# Patient Record
Sex: Male | Born: 1990 | Race: White | Hispanic: No | Marital: Married | Smoking: Never smoker
Health system: Southern US, Community
[De-identification: ages and names within clinical notes are randomized; demographics above are authoritative.]

## PROBLEM LIST (undated history)

## (undated) DIAGNOSIS — T783XXA Angioneurotic edema, initial encounter: Secondary | ICD-10-CM

## (undated) DIAGNOSIS — T8859XA Other complications of anesthesia, initial encounter: Secondary | ICD-10-CM

## (undated) DIAGNOSIS — R519 Headache, unspecified: Secondary | ICD-10-CM

## (undated) DIAGNOSIS — R42 Dizziness and giddiness: Secondary | ICD-10-CM

## (undated) DIAGNOSIS — A770 Spotted fever due to Rickettsia rickettsii: Secondary | ICD-10-CM

## (undated) HISTORY — DX: Dizziness and giddiness: R42

## (undated) HISTORY — DX: Angioneurotic edema, initial encounter: T78.3XXA

## (undated) HISTORY — DX: Headache, unspecified: R51.9

## (undated) HISTORY — DX: Spotted fever due to Rickettsia rickettsii: A77.0

---

## 2019-09-27 HISTORY — PX: SHOULDER SURGERY: SHX246

## 2019-12-05 ENCOUNTER — Ambulatory Visit: Payer: Self-pay | Attending: Family

## 2019-12-05 DIAGNOSIS — Z23 Encounter for immunization: Secondary | ICD-10-CM

## 2019-12-05 NOTE — Progress Notes (Signed)
   Covid-19 Vaccination Clinic  Name:  Trevor White    MRN: [id number] DOB: 05/03/1991  12/05/2019  [name] was observed post Covid-19 immunization for 15 minutes without incident. He was provided with Vaccine Information Sheet and instruction to access the V-Safe system.   [name] was instructed to call 911 with any severe reactions post vaccine: Marland Kitchen Difficulty breathing  . Swelling of face and throat  . A fast heartbeat  . A bad rash all over body  . Dizziness and weakness   Immunizations Administered    Name Date Dose VIS Date Route   Moderna COVID-19 Vaccine 12/05/2019  3:07 PM 0.5 mL 08/27/2019 Intramuscular   Manufacturer: Moderna   Lot: [id number]   NDC: 56812-751-70

## 2020-01-07 ENCOUNTER — Ambulatory Visit: Payer: Self-pay | Attending: Family

## 2020-01-07 DIAGNOSIS — Z23 Encounter for immunization: Secondary | ICD-10-CM

## 2020-01-07 NOTE — Progress Notes (Signed)
   Covid-19 Vaccination Clinic  Name:  Trevor White    MRN: [id number] DOB: 02-16-1991  01/07/2020  Mr. Trevor White was observed post Covid-19 immunization for 15 minutes without incident. He was provided with Vaccine Information Sheet and instruction to access the V-Safe system.   Mr. Trevor White was instructed to call 911 with any severe reactions post vaccine: Marland Kitchen Difficulty breathing  . Swelling of face and throat  . A fast heartbeat  . A bad rash all over body  . Dizziness and weakness   Immunizations Administered    Name Date Dose VIS Date Route   Moderna COVID-19 Vaccine 01/07/2020 11:37 AM 0.5 mL 08/27/2019 Intramuscular   Manufacturer: Moderna   Lot: [id number]   NDC: 31121-624-46

## 2021-05-28 ENCOUNTER — Other Ambulatory Visit: Payer: Self-pay

## 2021-05-28 ENCOUNTER — Encounter (HOSPITAL_COMMUNITY): Payer: Self-pay

## 2021-05-28 ENCOUNTER — Emergency Department (HOSPITAL_COMMUNITY): Payer: 59

## 2021-05-28 ENCOUNTER — Emergency Department (HOSPITAL_COMMUNITY)
Admission: EM | Admit: 2021-05-28 | Discharge: 2021-05-28 | Disposition: A | Discharge status: Home / Self Care | Payer: 59 | Attending: Emergency Medicine | Admitting: Emergency Medicine

## 2021-05-28 DIAGNOSIS — R1031 Right lower quadrant pain: Secondary | ICD-10-CM | POA: Diagnosis present

## 2021-05-28 LAB — URINALYSIS, ROUTINE W REFLEX MICROSCOPIC
Bilirubin Urine: NEGATIVE
Glucose, UA: NEGATIVE mg/dL
Hgb urine dipstick: NEGATIVE
Ketones, ur: NEGATIVE mg/dL
Leukocytes,Ua: NEGATIVE
Nitrite: NEGATIVE
Protein, ur: NEGATIVE mg/dL
Specific Gravity, Urine: 1.02 (ref 1.005–1.030)
pH: 8 (ref 5.0–8.0)

## 2021-05-28 LAB — COMPREHENSIVE METABOLIC PANEL
ALT: 18 U/L (ref 0–44)
AST: 26 U/L (ref 15–41)
Albumin: 4.4 g/dL (ref 3.5–5.0)
Alkaline Phosphatase: 44 U/L (ref 38–126)
Anion gap: 9 (ref 5–15)
BUN: 11 mg/dL (ref 6–20)
CO2: 25 mmol/L (ref 22–32)
Calcium: 9.3 mg/dL (ref 8.9–10.3)
Chloride: 101 mmol/L (ref 98–111)
Creatinine, Ser: 1.03 mg/dL (ref 0.61–1.24)
GFR, Estimated: >60 mL/min (ref >60)
Glucose, Bld: 101 mg/dL — ABNORMAL HIGH (ref 70–99)
Potassium: 4 mmol/L (ref 3.5–5.1)
Sodium: 135 mmol/L (ref 135–145)
Total Bilirubin: 1 mg/dL (ref 0.3–1.2)
Total Protein: 7.2 g/dL (ref 6.5–8.1)

## 2021-05-28 LAB — CBC WITH DIFFERENTIAL/PLATELET
Abs Immature Granulocytes: 0.01 10*3/uL (ref 0.00–0.07)
Basophils Absolute: 0 10*3/uL (ref 0.0–0.1)
Basophils Relative: 1 %
Eosinophils Absolute: 0.1 10*3/uL (ref 0.0–0.5)
Eosinophils Relative: 1 %
HCT: 45.2 % (ref 39.0–52.0)
Hemoglobin: 14.8 g/dL (ref 13.0–17.0)
Immature Granulocytes: 0 %
Lymphocytes Relative: 32 %
Lymphs Abs: 1.8 10*3/uL (ref 0.7–4.0)
MCH: 28.1 pg (ref 26.0–34.0)
MCHC: 32.7 g/dL (ref 30.0–36.0)
MCV: 85.9 fL (ref 80.0–100.0)
Monocytes Absolute: 0.4 10*3/uL (ref 0.1–1.0)
Monocytes Relative: 7 %
Neutro Abs: 3.4 10*3/uL (ref 1.7–7.7)
Neutrophils Relative %: 59 %
Platelets: 234 10*3/uL (ref 150–400)
RBC: 5.26 MIL/uL (ref 4.22–5.81)
RDW: 12.3 % (ref 11.5–15.5)
WBC: 5.7 10*3/uL (ref 4.0–10.5)
nRBC: 0 % (ref 0.0–0.2)

## 2021-05-28 LAB — LIPASE, BLOOD: Lipase: 28 U/L (ref 11–51)

## 2021-05-28 MED ORDER — IOHEXOL 350 MG/ML SOLN
100.0000 mL | Freq: Once | INTRAVENOUS | Status: AC | PRN
  Administered 2021-05-28: 100 mL via INTRAVENOUS

## 2021-05-28 MED ORDER — SODIUM CHLORIDE 0.9 % IV BOLUS
1000.0000 mL | Freq: Once | INTRAVENOUS | Status: AC
  Administered 2021-05-28: 1000 mL via INTRAVENOUS

## 2021-05-28 NOTE — ED Provider Notes (Signed)
[facility] EMERGENCY DEPARTMENT Provider Note   CSN: [id number] Arrival date & time: 05/28/21  1050     History Chief Complaint  Patient presents with   Abdominal Pain    Harce Volden is a 30 y.o. male.  30 year old male presents with complaint of right lower quadrant abdominal pain, sent by PCP with concern for appendicitis.  Patient states that he has had pain off and on for the past week, located periumbilical radiating to suprapubic area, described as a sharp/occasional gas pain.  Patient states that he usually feels better after going for a run however ran on the treadmill this morning which made his right lower quadrant pain worse.  Went to his doctor who pressed on his abdomen and has had constant/progressively worsening right lower quadrant pain since.  States that a few days ago he had 1 episode where he had some red blood in his stools, none since.  Also states 3 days ago his urine was cloudy with an odor but this has not reoccurred.  He denies testicular pain or swelling.  Last ate at 6 AM today, handful of blueberries.  No prior abdominal surgeries, no history of asthma or respiratory complaints, no other medical history.  Patient is a non-smoker states about 2 weeks ago he did have more alcohol than usual to drink although does not typically drink heavily.      History reviewed. No pertinent past medical history.  There are no problems to display for this patient.   History reviewed. No pertinent surgical history.     No family history on file.     Home Medications Prior to Admission medications   Medication Sig Start Date End Date Taking? Authorizing Provider  Magnesium 200 MG TABS Take 200 mg by mouth daily.   Yes [provider]    Allergies    Diclofenac sodium  Review of Systems   Review of Systems  Constitutional:  Negative for chills, diaphoresis and fever.  Respiratory:  Negative for shortness of breath.   Cardiovascular:   Negative for chest pain.  Gastrointestinal:  Positive for abdominal pain and blood in stool. Negative for constipation, diarrhea, nausea and vomiting.  Genitourinary:  Negative for dysuria, frequency, penile discharge, penile pain, penile swelling, scrotal swelling and testicular pain.  Musculoskeletal:  Negative for arthralgias, back pain and myalgias.  Skin:  Negative for rash and wound.  Allergic/Immunologic: Negative for immunocompromised state.  Neurological:  Negative for weakness.  Hematological:  Negative for adenopathy.  All other systems reviewed and are negative.  Physical Exam Updated Vital Signs BP 138/74 (BP Location: Left Arm)   Pulse 77   Temp 98.8 F (37.1 C) (Oral)   Resp 18   Ht 5\' 6"  (1.676 m)   Wt 58.1 kg   SpO2 100%   BMI 20.66 kg/m   Physical Exam Vitals and nursing note reviewed.  Constitutional:      General: He is not in acute distress.    Appearance: He is well-developed. He is not diaphoretic.  HENT:     Head: Normocephalic and atraumatic.  Cardiovascular:     Rate and Rhythm: Normal rate and regular rhythm.     Heart sounds: Normal heart sounds.  Pulmonary:     Effort: Pulmonary effort is normal.  Abdominal:     General: Abdomen is flat.     Palpations: Abdomen is soft.     Tenderness: There is abdominal tenderness in the right lower quadrant. There is no right  CVA tenderness or left CVA tenderness.  Skin:    General: Skin is warm and dry.  Neurological:     Mental Status: He is alert and oriented to person, place, and time.  Psychiatric:        Behavior: Behavior normal.    ED Results / Procedures / Treatments   Labs (all labs ordered are listed, but only abnormal results are displayed) Labs Reviewed  COMPREHENSIVE METABOLIC PANEL - Abnormal; Notable for the following components:      Result Value   Glucose, Bld 101 (*)    All other components within normal limits  URINALYSIS, ROUTINE W REFLEX MICROSCOPIC - Abnormal; Notable for  the following components:   APPearance CLOUDY (*)    All other components within normal limits  CBC WITH DIFFERENTIAL/PLATELET  LIPASE, BLOOD    EKG None  Radiology CT Abdomen Pelvis W Contrast  Result Date: 05/28/2021 CLINICAL DATA:  Right lower quadrant abdominal pain. EXAM: CT ABDOMEN AND PELVIS WITH CONTRAST TECHNIQUE: Multidetector CT imaging of the abdomen and pelvis was performed using the standard protocol following bolus administration of intravenous contrast. CONTRAST:  OMNIPAQUE IOHEXOL 350 MG/ML SOLN COMPARISON:  None. FINDINGS: Lower chest: No acute abnormality. Hepatobiliary: No focal liver abnormality is seen. No gallstones, gallbladder Purtee thickening, or biliary dilatation. Pancreas: Unremarkable. No pancreatic ductal dilatation or surrounding inflammatory changes. Spleen: Normal in size without focal abnormality. Adrenals/Urinary Tract: Adrenal glands are unremarkable. Kidneys are normal, without renal calculi, solid enhancing lesion, or hydronephrosis. Bladder is unremarkable for degree of distension. Stomach/Bowel: Stomach is grossly unremarkable. No pathologic dilation of small large bowel. The appendix and terminal ileum are unremarkable. No evidence of acute bowel inflammation. Vascular/Lymphatic: No significant vascular findings are present. No enlarged abdominal or pelvic lymph nodes. Reproductive: Prostate is unremarkable. Other: No abdominopelvic ascites. No walled off fluid collections. No pneumoperitoneum. Musculoskeletal: No acute or significant osseous findings. IMPRESSION: No acute abdominopelvic findings. Normal appendix. Electronically Signed   By: Maudry Mayhew M.D.   On: 05/28/2021 16:25    Procedures Procedures   Medications Ordered in ED Medications  sodium chloride 0.9 % bolus 1,000 mL (0 mLs Intravenous Stopped 05/28/21 1328)  iohexol (OMNIPAQUE) 350 MG/ML injection 100 mL (100 mLs Intravenous Contrast Given 05/28/21 1604)    ED Course  I have  reviewed the triage vital signs and the nursing notes.  Pertinent labs & imaging results that were available during my care of the patient were reviewed by me and considered in my medical decision making (see chart for details).  Clinical Course as of 05/28/21 1645  Fri May 28, 2021  1454 This is a otherwise healthy 30 year old male presented to ED with concern for right lower quadrant abdominal pain and poor p.o. intake for about 1 week.  He was referred in for appendicitis evaluation.  On clinical exam the patient is well-appearing.  He is afebrile.  He does have some mild right lower quadrant abdominal tenderness.  No rigidity on abdominal exam.  White blood cell count is normal.  Labs are otherwise unremarkable.  He is pending a CT scan of the abdomen. [MT]  1454 Of note, while in the emergency department using the bathroom, the patient reported abrupt onset of blurred vision in both of his eyes.  He denies loss of vision or blackened vision or visual cuts. He denies headache.  He says his symptoms have gradually improved.  He reports feeling very anxious in the Ed and wonders if the lighting made him  feel blurred.  On my exam vision is excellent, 20/10 both eyes, no peripheral field deficits, PERRL, EOM.  He has no headache, but this may be a prodrome for a migraine.  He feels that his left eye is slightly blurrier than the right and squiggly.  Seems less likely related to an aneurysm or subarachnoid hemorrhage without active headache. We will continue to monitor him as he is improving [MT]  1645 CT scan shows normal appendix.  Labs reassuring including CBC, CMP, lipase.  Urinalysis is cloudy otherwise unremarkable.  Vitals stable with O2 sat 100% on room air.  Patient plans to follow-up with GI, referral given.  Inquires about medications to take for his constipation, discussed use of MiraLAX and Colace.  Given return to ER precautions. [LM]    Clinical Course User Index [LM] Jeannie Fend,  PA-C [MT] Renaye Rakers Kermit Balo, MD   MDM Rules/Calculators/A&P                           Final Clinical Impression(s) / ED Diagnoses Final diagnoses:  Right lower quadrant abdominal pain    Rx / DC Orders ED Discharge Orders     None        Jeannie Fend, PA-C 05/28/21 1646    Terald Sleeper, MD 05/28/21 [phone]

## 2021-05-28 NOTE — ED Notes (Addendum)
When pt returned from restroom. He stated he has blurred vision and pressure behind eyes all of the sudden. Notified EDP

## 2021-05-28 NOTE — ED Notes (Signed)
EDP at the bedside. Turned off lights to see if it made a difference to pt vision. EDP will return in five minutes

## 2021-05-28 NOTE — ED Notes (Signed)
Pt ambulated to restroom with steady gait.

## 2021-05-28 NOTE — Discharge Instructions (Addendum)
MiraLAX and Colace as needed.  Given referral for GI today, you may call tomorrow to schedule an appointment.  Follow-up with your primary care provider as needed.  Return to ER for fever, worsening pain, vomiting or other concerns.

## 2021-05-28 NOTE — ED Triage Notes (Signed)
Pt c.o RLQ pain for the past week along with bloody stools last week, none this week. Denies n/v/d.

## 2021-10-01 DIAGNOSIS — M7712 Lateral epicondylitis, left elbow: Secondary | ICD-10-CM | POA: Diagnosis not present

## 2021-10-19 DIAGNOSIS — M25522 Pain in left elbow: Secondary | ICD-10-CM | POA: Diagnosis not present

## 2021-11-10 DIAGNOSIS — J069 Acute upper respiratory infection, unspecified: Secondary | ICD-10-CM | POA: Diagnosis not present

## 2022-03-24 DIAGNOSIS — R52 Pain, unspecified: Secondary | ICD-10-CM | POA: Diagnosis not present

## 2022-03-24 DIAGNOSIS — W57XXXA Bitten or stung by nonvenomous insect and other nonvenomous arthropods, initial encounter: Secondary | ICD-10-CM | POA: Diagnosis not present

## 2022-03-24 DIAGNOSIS — S90561A Insect bite (nonvenomous), right ankle, initial encounter: Secondary | ICD-10-CM | POA: Diagnosis not present

## 2022-03-24 DIAGNOSIS — J029 Acute pharyngitis, unspecified: Secondary | ICD-10-CM | POA: Diagnosis not present

## 2022-03-24 DIAGNOSIS — R5383 Other fatigue: Secondary | ICD-10-CM | POA: Diagnosis not present

## 2022-03-24 DIAGNOSIS — Z03818 Encounter for observation for suspected exposure to other biological agents ruled out: Secondary | ICD-10-CM | POA: Diagnosis not present

## 2022-03-24 DIAGNOSIS — S30865A Insect bite (nonvenomous) of unspecified external genital organs, male, initial encounter: Secondary | ICD-10-CM | POA: Diagnosis not present

## 2022-03-24 DIAGNOSIS — S20362A Insect bite (nonvenomous) of left front wall of thorax, initial encounter: Secondary | ICD-10-CM | POA: Diagnosis not present

## 2022-03-31 DIAGNOSIS — A770 Spotted fever due to Rickettsia rickettsii: Secondary | ICD-10-CM

## 2022-03-31 HISTORY — DX: Spotted fever due to Rickettsia rickettsii: A77.0

## 2022-04-01 DIAGNOSIS — R002 Palpitations: Secondary | ICD-10-CM | POA: Diagnosis not present

## 2022-04-01 DIAGNOSIS — W57XXXD Bitten or stung by nonvenomous insect and other nonvenomous arthropods, subsequent encounter: Secondary | ICD-10-CM | POA: Diagnosis not present

## 2022-04-01 DIAGNOSIS — S30865A Insect bite (nonvenomous) of unspecified external genital organs, male, initial encounter: Secondary | ICD-10-CM | POA: Diagnosis not present

## 2022-04-07 NOTE — Progress Notes (Signed)
Cardiology Office Note:    Date:  04/08/2022   ID:  August Saucer, DOB 1991/06/23, MRN [id number]  PCP:  [facility] Providers Cardiologist:  Alverda Skeans, MD Referring MD: Mitzi Hansen, NP   Chief Complaint/Reason for Referral: Palpitations  ASSESSMENT:    1. Palpitations   2. Precordial pain     PLAN:    In order of problems listed above: 1.  Palpitations: We will check CBC, reflex TSH, echocardiogram, and monitor.  We will keep follow-up open-ended depending on these results. 2.  Chest pain: Patient has symptoms of chest pain that seem atypical in nature.  We will refer for an exercise treadmill stress test.         Shared Decision Making/Informed Consent The risks [chest pain, shortness of breath, cardiac arrhythmias, dizziness, blood pressure fluctuations, myocardial infarction, stroke/transient ischemic attack, and life-threatening complications (estimated to be 1 in 10,000)], benefits (risk stratification, diagnosing coronary artery disease, treatment guidance) and alternatives of an exercise tolerance test were discussed in detail with Mr. Dishman and he agrees to proceed.   Dispo:  Return if symptoms worsen or fail to improve.      Medication Adjustments/Labs and Tests Ordered: Current medicines are reviewed at length with the patient today.  Concerns regarding medicines are outlined above.  The following changes have been made:  no change   Labs/tests ordered: Orders Placed This Encounter  Procedures   CBC with Differential/Platelet   TSH Rfx on Abnormal to Free T4   EXERCISE TOLERANCE TEST (ETT)   LONG TERM MONITOR (3-14 DAYS)   ECHOCARDIOGRAM COMPLETE    Medication Changes: No orders of the defined types were placed in this encounter.    Current medicines are reviewed at length with the patient today.  The patient does not have concerns regarding medicines.   History of Present Illness:    FOCUSED PROBLEM  LIST:   1.  [place] Spotted Fever infection 6/23  The patient is a 31 y.o. male with the indicated medical history here for palpitations.  The patient was seen by his primary care provider recently with complaints of palpitations.  An EKG was performed which was reassuring with normal sinus rhythm.  The patient was recently diagnosed with [facility] spotted fever and was started doxycycline earlier this week.  He contracted this back in June.  Since that time he has noticed increasing palpitations on a daily basis.  He will have a salvo of palpitations then they will remit.  There sometimes associated with chest pain and shortness of breath.  He denies any presyncope or syncope.  He denies any paroxysmal nocturnal dyspnea.  When he lays in bed on his right side he will feel palpitations.  Fortunately has not required any emergency room visits or hospitalizations.  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       Previous Medical History: None   Current Medications: Current Meds  Medication Sig   Bacillus Coagulans-Inulin (ALIGN PREBIOTIC-PROBIOTIC) 5-1.25 MG-GM CHEW 3 (three) times a week.   DOXYCYCLINE HYCLATE PO Take 100 mg by mouth 2 (two) times daily. Pt is instructed to take this medication BID for 14 days.     Allergies:    Diclofenac sodium   Social History:   Social History   Tobacco Use   Smoking status: Never     Family Hx: History reviewed. No pertinent family history.   Review of Systems:   Please  see the history of present illness.    All other systems reviewed and are negative.     EKGs/Labs/Other Test Reviewed:    EKG:  EKG performed July 2023 that I personally reviewed demonstrates sinus rhythm  Prior CV studies: None available    Other studies Reviewed: Review of the additional studies/records demonstrates: CT abdomen pelvis 2022 without aortic atherosclerosis  Recent Labs: 05/28/2021: ALT 18; BUN 11; Creatinine, Ser 1.03;  Hemoglobin 14.8; Platelets 234; Potassium 4.0; Sodium 135   Recent Lipid Panel No results found for: "CHOL", "TRIG", "HDL", "LDLCALC", "LDLDIRECT"  Risk Assessment/Calculations:           Physical Exam:    VS:  BP 110/62   Pulse 69   Ht 5\' 6"  (1.676 m)   Wt 126 lb 9.6 oz (57.4 kg)   SpO2 99%   BMI 20.43 kg/m    Wt Readings from Last 3 Encounters:  04/08/22 126 lb 9.6 oz (57.4 kg)  05/28/21 128 lb (58.1 kg)    GENERAL:  No apparent distress, AOx3 HEENT:  No carotid bruits, +2 carotid impulses, no scleral icterus CAR: RRR no murmurs, gallops, rubs, or thrills RES:  Clear to auscultation bilaterally ABD:  Soft, nontender, nondistended, positive bowel sounds x 4 VASC:  +2 radial pulses, +2 carotid pulses, palpable pedal pulses NEURO:  CN 2-12 grossly intact; motor and sensory grossly intact PSYCH:  No active depression or anxiety EXT:  No edema, ecchymosis, or cyanosis  Signed, 07/28/21, MD  04/08/2022 9:26 AM    [facility] [address] [place], [place], [place]  [place] Phone: [phone]; Fax: [phone]   Note:  This document was prepared using Dragon voice recognition software and may include unintentional dictation errors.

## 2022-04-08 ENCOUNTER — Ambulatory Visit (INDEPENDENT_AMBULATORY_CARE_PROVIDER_SITE_OTHER): Payer: 59

## 2022-04-08 ENCOUNTER — Encounter: Payer: Self-pay | Admitting: Internal Medicine

## 2022-04-08 ENCOUNTER — Ambulatory Visit: Payer: 59 | Admitting: Internal Medicine

## 2022-04-08 VITALS — BP 110/62 | HR 69 | Ht 66.0 in | Wt 126.6 lb

## 2022-04-08 DIAGNOSIS — R002 Palpitations: Secondary | ICD-10-CM

## 2022-04-08 DIAGNOSIS — R072 Precordial pain: Secondary | ICD-10-CM | POA: Diagnosis not present

## 2022-04-08 LAB — CBC WITH DIFFERENTIAL/PLATELET
Basophils Absolute: 0 10*3/uL (ref 0.0–0.2)
Basos: 1 %
EOS (ABSOLUTE): 0.1 10*3/uL (ref 0.0–0.4)
Eos: 1 %
Hematocrit: 43.5 % (ref 37.5–51.0)
Hemoglobin: 14.5 g/dL (ref 13.0–17.7)
Lymphocytes Absolute: 2.6 10*3/uL (ref 0.7–3.1)
Lymphs: 44 %
MCH: 27.7 pg (ref 26.6–33.0)
MCHC: 33.3 g/dL (ref 31.5–35.7)
MCV: 83 fL (ref 79–97)
Monocytes Absolute: 0.4 10*3/uL (ref 0.1–0.9)
Monocytes: 7 %
Neutrophils Absolute: 2.8 10*3/uL (ref 1.4–7.0)
Neutrophils: 47 %
Platelets: 266 10*3/uL (ref 150–450)
RBC: 5.23 x10E6/uL (ref 4.14–5.80)
RDW: 13.6 % (ref 11.6–15.4)
WBC: 6 10*3/uL (ref 3.4–10.8)

## 2022-04-08 LAB — TSH RFX ON ABNORMAL TO FREE T4: TSH: 3.14 u[IU]/mL (ref 0.450–4.500)

## 2022-04-08 NOTE — Progress Notes (Unsigned)
Enrolled patient for a 3 day Zio XT monitor to be mailed to patients home  

## 2022-04-08 NOTE — Patient Instructions (Addendum)
Medication Instructions:  Your physician recommends that you continue on your current medications as directed. Please refer to the Current Medication list given to you today.  *If you need a refill on your cardiac medications before your next appointment, please call your pharmacy*  Lab Work: Your physician recommends that you have lab work today- Reflex TSH and CBC   If you have labs (blood work) drawn today and your tests are completely normal, you will receive your results only by: MyChart Message (if you have MyChart) OR A paper copy in the mail If you have any lab test that is abnormal or we need to change your treatment, we will call you to review the results.  Testing/Procedures: Your physician has requested that you have an exercise tolerance test. For further information please visit [email or web address] Please also follow instruction sheet, as given.  Your physician has requested that you have an echocardiogram. Echocardiography is a painless test that uses sound waves to create images of your heart. It provides your doctor with information about the size and shape of your heart and how well your heart's chambers and valves are working. This procedure takes approximately one hour. There are no restrictions for this procedure.  Your physician has recommended that you wear a zio patch monitor for 3 days.  Event monitors are medical devices that record the heart's electrical activity. Doctors most often [place] these monitors to diagnose arrhythmias. Arrhythmias are problems with the speed or rhythm of the heartbeat. The monitor is a small, portable device. You can wear one while you do your normal daily activities. This is usually used to diagnose what is causing palpitations/syncope (passing out).  Follow-Up: At [facility], you and your health needs are our priority.  As part of our continuing mission to provide you with exceptional heart care, we have created designated Provider Care  Teams.  These Care Teams include your primary Cardiologist (physician) and Advanced Practice Providers (APPs -  Physician Assistants and Nurse Practitioners) who all work together to provide you with the care you need, when you need it.  We recommend signing up for the patient portal called "MyChart".  Sign up information is provided on this After Visit Summary.  MyChart is used to connect with patients for Virtual Visits (Telemedicine).  Patients are able to view lab/test results, encounter notes, upcoming appointments, etc.  Non-urgent messages can be sent to your provider as well.   To learn more about what you can do with MyChart, go to [email or web address].    Your next appointment:   As needed  The format for your next appointment:   In Person  Provider:   Orbie Pyo, MD {  Other Instructions Christena Deem- Long Term Monitor Instructions  Your physician has requested you wear a ZIO patch monitor for 14 days.  This is a single patch monitor. [employer] supplies one patch monitor per enrollment. Additional stickers are not available. Please do not apply patch if you will be having a Nuclear Stress Test,  Echocardiogram, Cardiac CT, MRI, or Chest Xray during the period you would be wearing the  monitor. The patch cannot be worn during these tests. You cannot remove and re-apply the  ZIO XT patch monitor.  Your ZIO patch monitor will be mailed 3 day USPS to your address on file. It may take 3-5 days  to receive your monitor after you have been enrolled.  Once you have received your monitor, please review the enclosed instructions. Your monitor  has  already been registered assigning a specific monitor serial # to you.  Billing and Patient Assistance Program Information  We have supplied [employer] with any of your insurance information on file for billing purposes. [employer] offers a sliding scale Patient Assistance Program for patients that do not have  insurance, or whose insurance does  not completely cover the cost of the ZIO monitor.  You must apply for the Patient Assistance Program to qualify for this discounted rate.  To apply, please call [employer] at [phone], select option 4, select option 2, ask to apply for  Patient Assistance Program. Meredeth Ide will ask your household income, and how many people  are in your household. They will quote your out-of-pocket cost based on that information.  [employer] will also be able to set up a 27-month, interest-free payment plan if needed.  Applying the monitor   Shave hair from upper left chest.  Hold abrader disc by orange tab. Rub abrader in 40 strokes over the upper left chest as  indicated in your monitor instructions.  Clean area with 4 enclosed alcohol pads. Let dry.  Apply patch as indicated in monitor instructions. Patch will be placed under collarbone on left  side of chest with arrow pointing upward.  Rub patch adhesive wings for 2 minutes. Remove white label marked "1". Remove the white  label marked "2". Rub patch adhesive wings for 2 additional minutes.  While looking in a mirror, press and release button in center of patch. A small green light will  flash 3-4 times. This will be your only indicator that the monitor has been turned on.  Do not shower for the first 24 hours. You may shower after the first 24 hours.  Press the button if you feel a symptom. You will hear a small click. Record Date, Time and  Symptom in the Patient Logbook.  When you are ready to remove the patch, follow instructions on the last 2 pages of Patient  Logbook. Stick patch monitor onto the last page of Patient Logbook.  Place Patient Logbook in the blue and white box. Use locking tab on box and tape box closed  securely. The blue and white box has prepaid postage on it. Please place it in the mailbox as  soon as possible. Your physician should have your test results approximately 7 days after the  monitor has been mailed back to [facility].   Call [facility] Customer Care at [phone] if you have questions regarding  your ZIO XT patch monitor. Call them immediately if you see an orange light blinking on your  monitor.  If your monitor falls off in less than 4 days, contact our Monitor department at [phone].  If your monitor becomes loose or falls off after 4 days call [employer] at [phone] for  suggestions on securing your monitor   Important Information About Sugar

## 2022-04-10 IMAGING — CT CT ABD-PELV W/ CM
1 series · 1 of 1 positions shown · IV contrast (omnipaque)
Comparison: None.

CLINICAL DATA: Right lower quadrant abdominal pain.

EXAM:
CT ABDOMEN AND PELVIS WITH CONTRAST
TECHNIQUE: Multidetector CT imaging of the abdomen and pelvis was performed
using the standard protocol following bolus administration of
intravenous contrast.
CONTRAST:  100mL OMNIPAQUE IOHEXOL 350 MG/ML SOLN

[Series 2: topogram 0.6 t20f · sagittal · 1.00mm/px · 1 of 1 slices shown]
[im 1/1]
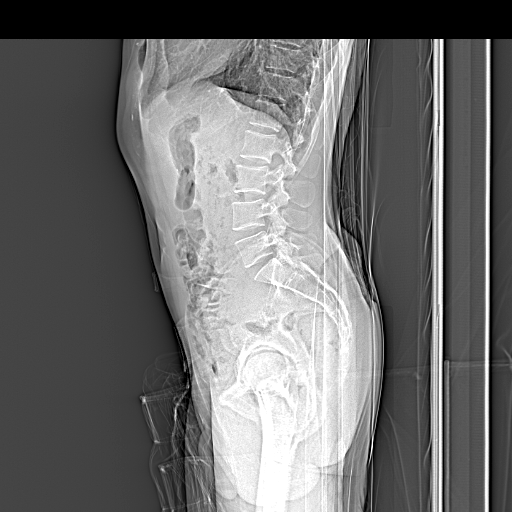

[1 of 1 positions shown; findings below may reference images not displayed]

FINDINGS: Lower chest: No acute abnormality.

Hepatobiliary: No focal liver abnormality is seen. No gallstones,
gallbladder wall thickening, or biliary dilatation.

Pancreas: Unremarkable. No pancreatic ductal dilatation or
surrounding inflammatory changes.

Spleen: Normal in size without focal abnormality.

Adrenals/Urinary Tract: Adrenal glands are unremarkable. Kidneys are
normal, without renal calculi, solid enhancing lesion, or
hydronephrosis. Bladder is unremarkable for degree of distension.

Stomach/Bowel: Stomach is grossly unremarkable. No pathologic
dilation of small large bowel. The appendix and terminal ileum are
unremarkable. No evidence of acute bowel inflammation.

Vascular/Lymphatic: No significant vascular findings are present. No
enlarged abdominal or pelvic lymph nodes.

Reproductive: Prostate is unremarkable.

Other: No abdominopelvic ascites. No walled off fluid collections.
No pneumoperitoneum.

Musculoskeletal: No acute or significant osseous findings.
IMPRESSION: No acute abdominopelvic findings. Normal appendix.

## 2022-04-12 DIAGNOSIS — R002 Palpitations: Secondary | ICD-10-CM

## 2022-04-12 DIAGNOSIS — R072 Precordial pain: Secondary | ICD-10-CM | POA: Diagnosis not present

## 2022-04-15 ENCOUNTER — Telehealth: Payer: Self-pay | Admitting: Internal Medicine

## 2022-04-15 NOTE — Telephone Encounter (Signed)
Returned call to patient and answered all his questions. He is able to wear the montior up to 14 days.

## 2022-04-15 NOTE — Telephone Encounter (Signed)
Patient calling in to see if he can keep the monitor on for a little longer. Please advise

## 2022-04-18 DIAGNOSIS — A770 Spotted fever due to Rickettsia rickettsii: Secondary | ICD-10-CM | POA: Diagnosis not present

## 2022-04-18 DIAGNOSIS — R21 Rash and other nonspecific skin eruption: Secondary | ICD-10-CM | POA: Diagnosis not present

## 2022-04-18 DIAGNOSIS — R519 Headache, unspecified: Secondary | ICD-10-CM | POA: Diagnosis not present

## 2022-04-21 ENCOUNTER — Ambulatory Visit (INDEPENDENT_AMBULATORY_CARE_PROVIDER_SITE_OTHER): Payer: 59

## 2022-04-21 ENCOUNTER — Ambulatory Visit (HOSPITAL_COMMUNITY): Payer: 59 | Attending: Cardiology

## 2022-04-21 DIAGNOSIS — R002 Palpitations: Secondary | ICD-10-CM | POA: Insufficient documentation

## 2022-04-21 DIAGNOSIS — R072 Precordial pain: Secondary | ICD-10-CM

## 2022-04-21 LAB — EXERCISE TOLERANCE TEST
Angina Index: 0
Duke Treadmill Score: 18
Estimated workload: 20.7
Exercise duration (min): 18 min
Exercise duration (sec): 0 s
MPHR: 189 {beats}/min
Peak HR: 184 {beats}/min
Percent HR: 97 %
RPE: 16
Rest HR: 71 {beats}/min
ST Depression (mm): 0 mm

## 2022-04-21 LAB — ECHOCARDIOGRAM COMPLETE: Area-P 1/2: 3.1 cm2

## 2022-04-22 DIAGNOSIS — R002 Palpitations: Secondary | ICD-10-CM | POA: Diagnosis not present

## 2022-04-22 DIAGNOSIS — R072 Precordial pain: Secondary | ICD-10-CM | POA: Diagnosis not present

## 2022-05-03 DIAGNOSIS — A770 Spotted fever due to Rickettsia rickettsii: Secondary | ICD-10-CM | POA: Diagnosis not present

## 2022-05-03 DIAGNOSIS — R519 Headache, unspecified: Secondary | ICD-10-CM | POA: Diagnosis not present

## 2022-05-04 ENCOUNTER — Encounter (HOSPITAL_COMMUNITY): Payer: Self-pay | Admitting: Emergency Medicine

## 2022-05-04 ENCOUNTER — Observation Stay (HOSPITAL_COMMUNITY)
Admission: EM | Admit: 2022-05-04 | Discharge: 2022-05-06 | Disposition: A | Discharge status: Home / Self Care | Payer: 59 | Attending: Internal Medicine | Admitting: Internal Medicine

## 2022-05-04 ENCOUNTER — Other Ambulatory Visit: Payer: Self-pay

## 2022-05-04 ENCOUNTER — Emergency Department (HOSPITAL_COMMUNITY): Payer: 59

## 2022-05-04 DIAGNOSIS — R509 Fever, unspecified: Secondary | ICD-10-CM | POA: Insufficient documentation

## 2022-05-04 DIAGNOSIS — R42 Dizziness and giddiness: Secondary | ICD-10-CM | POA: Diagnosis not present

## 2022-05-04 DIAGNOSIS — Z79899 Other long term (current) drug therapy: Secondary | ICD-10-CM | POA: Diagnosis not present

## 2022-05-04 DIAGNOSIS — Z20822 Contact with and (suspected) exposure to covid-19: Secondary | ICD-10-CM | POA: Diagnosis not present

## 2022-05-04 DIAGNOSIS — R519 Headache, unspecified: Principal | ICD-10-CM | POA: Insufficient documentation

## 2022-05-04 DIAGNOSIS — R9431 Abnormal electrocardiogram [ECG] [EKG]: Secondary | ICD-10-CM | POA: Diagnosis not present

## 2022-05-04 LAB — COMPREHENSIVE METABOLIC PANEL
ALT: 19 U/L (ref 0–44)
AST: 21 U/L (ref 15–41)
Albumin: 4.3 g/dL (ref 3.5–5.0)
Alkaline Phosphatase: 56 U/L (ref 38–126)
Anion gap: 9 (ref 5–15)
BUN: 10 mg/dL (ref 6–20)
CO2: 25 mmol/L (ref 22–32)
Calcium: 9.5 mg/dL (ref 8.9–10.3)
Chloride: 103 mmol/L (ref 98–111)
Creatinine, Ser: 1 mg/dL (ref 0.61–1.24)
GFR, Estimated: >60 mL/min (ref >60)
Glucose, Bld: 112 mg/dL — ABNORMAL HIGH (ref 70–99)
Potassium: 3.8 mmol/L (ref 3.5–5.1)
Sodium: 137 mmol/L (ref 135–145)
Total Bilirubin: 0.9 mg/dL (ref 0.3–1.2)
Total Protein: 7.4 g/dL (ref 6.5–8.1)

## 2022-05-04 LAB — CBC WITH DIFFERENTIAL/PLATELET
Abs Immature Granulocytes: 0.04 10*3/uL (ref 0.00–0.07)
Basophils Absolute: 0 10*3/uL (ref 0.0–0.1)
Basophils Relative: 0 %
Eosinophils Absolute: 0 10*3/uL (ref 0.0–0.5)
Eosinophils Relative: 0 %
HCT: 41.9 % (ref 39.0–52.0)
Hemoglobin: 14.1 g/dL (ref 13.0–17.0)
Immature Granulocytes: 1 %
Lymphocytes Relative: 6 %
Lymphs Abs: 0.5 10*3/uL — ABNORMAL LOW (ref 0.7–4.0)
MCH: 27.9 pg (ref 26.0–34.0)
MCHC: 33.7 g/dL (ref 30.0–36.0)
MCV: 83 fL (ref 80.0–100.0)
Monocytes Absolute: 0.6 10*3/uL (ref 0.1–1.0)
Monocytes Relative: 6 %
Neutro Abs: 7.7 10*3/uL (ref 1.7–7.7)
Neutrophils Relative %: 87 %
Platelets: 207 10*3/uL (ref 150–400)
RBC: 5.05 MIL/uL (ref 4.22–5.81)
RDW: 12.4 % (ref 11.5–15.5)
WBC: 8.9 10*3/uL (ref 4.0–10.5)
nRBC: 0 % (ref 0.0–0.2)

## 2022-05-04 NOTE — ED Provider Triage Note (Signed)
Emergency Medicine Provider Triage Evaluation Note  Trevor White , a 31 y.o. male  was evaluated in triage.  Pt complains of headache, neck stiffness has been ongoing for the last 9 days.  Diagnosed with [facility] spotted fever 4 weeks ago, was given 1 week of doxycycline, reports taking this medication after finishing this developed a headache which describes a throbbing sensation to the back of his head.  Exacerbated with any movement and rotation of his neck.  Also developed a rash to bilateral upper hands.. Fever up to 104 at home.  Review of Systems  Positive: Rash, headache, fever Negative: Vision loss, nausea, vomiting, abdominal pain  Physical Exam  BP 124/66 (BP Location: Left Arm)   Pulse (!) 111   Temp 98.7 F (37.1 C) (Oral)   Resp 20   SpO2 100%  Gen:   Awake, no distress   Resp:  Normal effort  MSK:   Moves extremities without difficulty  Other:  Full ROM of his neck, no meningeal signs.  Upper and lower extremities with good strength.  Medical Decision Making  Medically screening exam initiated at 5:09 PM.  Appropriate orders placed.  Flay Ghosh was informed that the remainder of the evaluation will be completed by another provider, this initial triage assessment does not replace that evaluation, and the importance of remaining in the ED until their evaluation is complete.     Claude Manges, PA-C 05/04/22 1714

## 2022-05-04 NOTE — ED Triage Notes (Signed)
Pt has diagnosis of [facility] spotted fever.  Has taken 3 weeks of doxycycline.  Today developed temp of 104, some confusion, (forgetting what he was doing), and headache  Sent here by Urgent care.  They did medicate his fever however and pt states he does feel much better since then.

## 2022-05-04 NOTE — ED Provider Notes (Incomplete)
[facility] EMERGENCY DEPARTMENT Provider Note  CSN: [id number] Arrival date & time: 05/04/22 1551  Chief Complaint(s) Headache  HPI Trevor White is a 31 y.o. male {Add pertinent medical, surgical, social history, OB history to HPI:1}    Headache   Past Medical History History reviewed. No pertinent past medical history. There are no problems to display for this patient.  Home Medication(s) Prior to Admission medications   Medication Sig Start Date End Date Taking? Authorizing Provider  Bacillus Coagulans-Inulin (ALIGN PREBIOTIC-PROBIOTIC) 5-1.25 MG-GM CHEW 3 (three) times a week.    [provider]  DOXYCYCLINE HYCLATE PO Take 100 mg by mouth 2 (two) times daily. Pt is instructed to take this medication BID for 14 days.    [provider]                                                                                                                                    Allergies Diclofenac sodium  Review of Systems Review of Systems  Neurological:  Positive for headaches.   As noted in HPI  Physical Exam Vital Signs  I have reviewed the triage vital signs BP 121/60   Pulse (!) 103   Temp 98.6 F (37 C)   Resp 16   SpO2 100%  *** Physical Exam  ED Results and Treatments Labs (all labs ordered are listed, but only abnormal results are displayed) Labs Reviewed  CBC WITH DIFFERENTIAL/PLATELET - Abnormal; Notable for the following components:      Result Value   Lymphs Abs 0.5 (*)    All other components within normal limits  COMPREHENSIVE METABOLIC PANEL - Abnormal; Notable for the following components:   Glucose, Bld 112 (*)    All other components within normal limits                                                                                                                         EKG  EKG Interpretation  Date/Time:    Ventricular Rate:    PR Interval:    QRS Duration:   QT Interval:    QTC Calculation:   R  Axis:     Text Interpretation:         Radiology No results found.  Medications Ordered in ED Medications - No data to display  Procedures Procedures  (including critical care time)  Medical Decision Making / ED Course   Medical Decision Making         Final Clinical Impression(s) / ED Diagnoses Final diagnoses:  None    {Document critical care time when appropriate:1}  {Document review of labs and clinical decision tools ie heart score, Chads2Vasc2 etc:1}  {Document your independent review of radiology images, and any outside records:1} {Document your discussion with family members, caretakers, and with consultants:1} {Document social determinants of health affecting pt's care:1} {Document your decision making why or why not admission, treatments were needed:1} This chart was dictated using voice recognition software.  Despite best efforts to proofread,  errors can occur which can change the documentation meaning.

## 2022-05-04 NOTE — ED Notes (Signed)
PA at bedside.

## 2022-05-04 NOTE — ED Notes (Signed)
Pt ambulatory to restroom and back.

## 2022-05-05 ENCOUNTER — Emergency Department (HOSPITAL_COMMUNITY): Payer: 59

## 2022-05-05 ENCOUNTER — Encounter (HOSPITAL_COMMUNITY): Payer: Self-pay | Admitting: Internal Medicine

## 2022-05-05 DIAGNOSIS — R519 Headache, unspecified: Secondary | ICD-10-CM | POA: Diagnosis present

## 2022-05-05 DIAGNOSIS — A770 Spotted fever due to Rickettsia rickettsii: Secondary | ICD-10-CM

## 2022-05-05 DIAGNOSIS — R509 Fever, unspecified: Secondary | ICD-10-CM | POA: Diagnosis not present

## 2022-05-05 DIAGNOSIS — G4459 Other complicated headache syndrome: Secondary | ICD-10-CM

## 2022-05-05 LAB — CBC WITH DIFFERENTIAL/PLATELET
Abs Immature Granulocytes: 0.03 10*3/uL (ref 0.00–0.07)
Basophils Absolute: 0 10*3/uL (ref 0.0–0.1)
Basophils Relative: 0 %
Eosinophils Absolute: 0 10*3/uL (ref 0.0–0.5)
Eosinophils Relative: 0 %
HCT: 42.9 % (ref 39.0–52.0)
Hemoglobin: 14.3 g/dL (ref 13.0–17.0)
Immature Granulocytes: 0 %
Lymphocytes Relative: 10 %
Lymphs Abs: 0.8 10*3/uL (ref 0.7–4.0)
MCH: 27.7 pg (ref 26.0–34.0)
MCHC: 33.3 g/dL (ref 30.0–36.0)
MCV: 83.1 fL (ref 80.0–100.0)
Monocytes Absolute: 0.6 10*3/uL (ref 0.1–1.0)
Monocytes Relative: 8 %
Neutro Abs: 6.1 10*3/uL (ref 1.7–7.7)
Neutrophils Relative %: 82 %
Platelets: 219 10*3/uL (ref 150–400)
RBC: 5.16 MIL/uL (ref 4.22–5.81)
RDW: 12.7 % (ref 11.5–15.5)
WBC: 7.5 10*3/uL (ref 4.0–10.5)
nRBC: 0 % (ref 0.0–0.2)

## 2022-05-05 LAB — CSF CELL COUNT WITH DIFFERENTIAL
RBC Count, CSF: 2 /mm3 — ABNORMAL HIGH
Tube #: 1

## 2022-05-05 LAB — RESP PANEL BY RT-PCR (FLU A&B, COVID) ARPGX2
Influenza A by PCR: NEGATIVE
Influenza B by PCR: NEGATIVE
SARS Coronavirus 2 by RT PCR: NEGATIVE

## 2022-05-05 LAB — HIV ANTIBODY (ROUTINE TESTING W REFLEX): HIV Screen 4th Generation wRfx: NONREACTIVE

## 2022-05-05 LAB — BASIC METABOLIC PANEL
Anion gap: 9 (ref 5–15)
BUN: 10 mg/dL (ref 6–20)
CO2: 24 mmol/L (ref 22–32)
Calcium: 9.4 mg/dL (ref 8.9–10.3)
Chloride: 102 mmol/L (ref 98–111)
Creatinine, Ser: 1.06 mg/dL (ref 0.61–1.24)
GFR, Estimated: >60 mL/min (ref >60)
Glucose, Bld: 119 mg/dL — ABNORMAL HIGH (ref 70–99)
Potassium: 3.3 mmol/L — ABNORMAL LOW (ref 3.5–5.1)
Sodium: 135 mmol/L (ref 135–145)

## 2022-05-05 LAB — HEPATIC FUNCTION PANEL
ALT: 18 U/L (ref 0–44)
AST: 22 U/L (ref 15–41)
Albumin: 4.3 g/dL (ref 3.5–5.0)
Alkaline Phosphatase: 59 U/L (ref 38–126)
Bilirubin, Direct: 0.1 mg/dL (ref 0.0–0.2)
Indirect Bilirubin: 0.5 mg/dL (ref 0.3–0.9)
Total Bilirubin: 0.6 mg/dL (ref 0.3–1.2)
Total Protein: 7.6 g/dL (ref 6.5–8.1)

## 2022-05-05 LAB — CRYPTOCOCCAL ANTIGEN, CSF: Crypto Ag: NEGATIVE

## 2022-05-05 LAB — PROTEIN AND GLUCOSE, CSF
Glucose, CSF: 74 mg/dL — ABNORMAL HIGH (ref 40–70)
Total  Protein, CSF: 32 mg/dL (ref 15–45)

## 2022-05-05 LAB — LIPASE, BLOOD: Lipase: 27 U/L (ref 11–51)

## 2022-05-05 MED ORDER — ACETAMINOPHEN 325 MG PO TABS: 650.0000 mg | ORAL_TABLET | Freq: Four times a day (QID) | ORAL | Status: DC | PRN

## 2022-05-05 MED ORDER — SODIUM CHLORIDE 0.9 % IV SOLN
1.0000 g | Freq: Once | INTRAVENOUS | Status: AC
  Administered 2022-05-05: 1 g via INTRAVENOUS
  Filled 2022-05-05: qty 10

## 2022-05-05 MED ORDER — SODIUM CHLORIDE 0.9 % IV BOLUS
1000.0000 mL | Freq: Once | INTRAVENOUS | Status: AC
  Administered 2022-05-05: 1000 mL via INTRAVENOUS

## 2022-05-05 MED ORDER — ACETAMINOPHEN 325 MG PO TABS
325.0000 mg | ORAL_TABLET | Freq: Once | ORAL | Status: AC
  Administered 2022-05-05: 325 mg via ORAL

## 2022-05-05 MED ORDER — LACTATED RINGERS IV SOLN: INTRAVENOUS | Status: AC

## 2022-05-05 MED ORDER — SODIUM CHLORIDE 0.9 % IV SOLN
100.0000 mg | Freq: Two times a day (BID) | INTRAVENOUS | Status: DC
  Administered 2022-05-05 – 2022-05-06 (×2): 100 mg via INTRAVENOUS
  Filled 2022-05-05 (×3): qty 100

## 2022-05-05 MED ORDER — DEXAMETHASONE SODIUM PHOSPHATE 10 MG/ML IJ SOLN
10.0000 mg | Freq: Once | INTRAMUSCULAR | Status: AC
  Administered 2022-05-05: 10 mg via INTRAVENOUS
  Filled 2022-05-05: qty 1

## 2022-05-05 MED ORDER — ORAL CARE MOUTH RINSE: 15.0000 mL | OROMUCOSAL | Status: DC | PRN

## 2022-05-05 MED ORDER — SODIUM CHLORIDE 0.9 % IV SOLN
100.0000 mg | Freq: Once | INTRAVENOUS | Status: AC
  Administered 2022-05-05: 100 mg via INTRAVENOUS
  Filled 2022-05-05: qty 100

## 2022-05-05 NOTE — ED Notes (Signed)
Provider at bedside

## 2022-05-05 NOTE — ED Notes (Signed)
Lab called to add on lipase.

## 2022-05-05 NOTE — ED Provider Notes (Addendum)
[facility] EMERGENCY DEPARTMENT Provider Note   CSN: [id number] Arrival date & time: 05/04/22  1551     History  Chief Complaint  Patient presents with   Headache    Trevor White is a 31 y.o. 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  Diagnosed with [facility] spotted fever 4 weeks ago.  Treated with 3-week course of doxycycline.  Has had persistent headache since that diagnosis.  Headache located in the back and top portion of his head.  Persistent headache and fever at home prompted him to seek medical evaluation at urgent care clinic. There he had a fever of 104, endorsed neck stiffness, and headache.  Given his presentation with clinical provider thought it necessary for him to be transferred to the emergency department and evaluated for meningitis versus encephalitis.  Was given Tylenol for fever at the clinic.  He was brought to the emergency department by his wife. Denies head trauma.     Headache     Home Medications Prior to Admission medications   Medication Sig Start Date End Date Taking? Authorizing Provider  Bacillus Coagulans-Inulin (ALIGN PREBIOTIC-PROBIOTIC) 5-1.25 MG-GM CHEW 3 (three) times a week.    [provider]  DOXYCYCLINE HYCLATE PO Take 100 mg by mouth 2 (two) times daily. Pt is instructed to take this medication BID for 14 days.    [provider]      Allergies    Diclofenac sodium    Review of Systems   Review of Systems  Neurological:  Positive for headaches.    Physical Exam Updated Vital Signs BP (!) 117/58 (BP Location: Left Arm)   Pulse 90   Temp 98.9 F (37.2 C) (Oral)   Resp 18   SpO2 100%  Physical Exam Vitals and nursing note reviewed.  HENT:     Head: Normocephalic and atraumatic.     Mouth/Throat:     Mouth: Mucous membranes are moist.  Eyes:     General:        Right eye: No discharge.        Left eye: No discharge.     Conjunctiva/sclera: Conjunctivae normal.   Cardiovascular:     Rate and Rhythm: Normal rate and regular rhythm.     Pulses: Normal pulses.     Heart sounds: Normal heart sounds.  Pulmonary:     Effort: Pulmonary effort is normal.     Breath sounds: Normal breath sounds.  Abdominal:     General: Abdomen is flat.     Palpations: Abdomen is soft.  Musculoskeletal:     Cervical back: Normal range of motion.  Skin:    General: Skin is warm and dry.  Neurological:     General: No focal deficit present.  Psychiatric:        Mood and Affect: Mood normal.     ED Results / Procedures / Treatments   Labs (all labs ordered are listed, but only abnormal results are displayed) Labs Reviewed  CBC WITH DIFFERENTIAL/PLATELET - Abnormal; Notable for the following components:      Result Value   Lymphs Abs 0.5 (*)    All other components within normal limits  COMPREHENSIVE METABOLIC PANEL - Abnormal; Notable for the following components:   Glucose, Bld 112 (*)    All other components within normal limits  RESP PANEL BY RT-PCR (FLU A&B, COVID) ARPGX2  CSF CULTURE W GRAM STAIN  CULTURE, BLOOD (ROUTINE X 2)  CULTURE, BLOOD (ROUTINE  X 2)  LIPASE, BLOOD  CSF CELL COUNT WITH DIFFERENTIAL  PROTEIN AND GLUCOSE, CSF  ROCKY [place]N SPOTTED FVR ABS PNL(IGG+IGM)  HIV ANTIBODY (ROUTINE TESTING W REFLEX)  HSV 1/2 PCR, CSF  CRYPTOCOCCAL ANTIGEN, CSF  VDRL, CSF    EKG EKG Interpretation  Date/Time:  Thursday May 05 2022 00:42:42 EDT Ventricular Rate:  98 PR Interval:  146 QRS Duration: 100 QT Interval:  338 QTC Calculation: 431 R Axis:   76 Text Interpretation: Normal sinus rhythm Nonspecific T wave abnormality Abnormal ECG No previous ECGs available Confirmed by Addison Lank [phone]) on 05/05/2022 12:57:06 AM  Radiology DG Chest 2 View  Result Date: 05/05/2022 CLINICAL DATA:  Fever, URI symptoms EXAM: CHEST - 2 VIEW COMPARISON:  04/27/2021 FINDINGS: The heart size and mediastinal contours are within normal limits. Both lungs are  clear. The visualized skeletal structures are unremarkable. IMPRESSION: Normal study. Electronically Signed   By: Rolm Baptise M.D.   On: 05/05/2022 01:00   CT HEAD WO CONTRAST (5MM)  Result Date: 05/04/2022 CLINICAL DATA:  Headache, new or worsening, positional (Age 48-49y) EXAM: CT HEAD WITHOUT CONTRAST TECHNIQUE: Contiguous axial images were obtained from the base of the skull through the vertex without intravenous contrast. RADIATION DOSE REDUCTION: This exam was performed according to the departmental dose-optimization program which includes automated exposure control, adjustment of the mA and/or kV according to patient size and/or use of iterative reconstruction technique. COMPARISON:  None Available. FINDINGS: Brain: No intracranial hemorrhage, mass effect, or midline shift. No hydrocephalus. The basilar cisterns are patent. No evidence of territorial infarct or acute ischemia. No extra-axial or intracranial fluid collection. Vascular: No hyperdense vessel or unexpected calcification. Skull: No fracture or focal lesion. Sinuses/Orbits: Paranasal sinuses and mastoid air cells are clear. The visualized orbits are unremarkable. Other: None. IMPRESSION: Negative noncontrast head CT. Electronically Signed   By: Keith Rake M.D.   On: 05/04/2022 23:42    Procedures .Lumbar Puncture  Date/Time: 05/05/2022 4:17 AM  Performed by: Harriet Pho, PA-C Authorized by: Harriet Pho, PA-C   Consent:    Consent obtained:  Written   Consent given by:  Patient   Risks, benefits, and alternatives were discussed: yes     Risks discussed:  Infection, headache, nerve damage and pain Universal protocol:    Procedure explained and questions answered to patient or proxy's satisfaction: yes     Relevant documents present and verified: yes     Test results available: yes     Imaging studies available: yes     Patient identity confirmed:  Verbally with patient and arm band Pre-procedure details:     Procedure purpose:  Diagnostic   Preparation: Patient was prepped and draped in usual sterile fashion   Anesthesia:    Anesthesia method:  Local infiltration   Local anesthetic:  Lidocaine 1% w/o epi Procedure details:    Lumbar space:  L3-L4 interspace   Patient position:  L lateral decubitus   Needle gauge:  18   Needle type:  Spinal needle - Quincke tip   Needle length (in):  3.5   Ultrasound guidance: no     Number of attempts:  1   Opening pressure (cm H2O):  24   Fluid appearance:  Clear   Tubes of fluid:  4   Total volume (ml):  5 Post-procedure details:    Puncture site:  Adhesive bandage applied and direct pressure applied   Procedure completion:  Tolerated well, no immediate complications  Medications Ordered in ED Medications  acetaminophen (TYLENOL) tablet 650 mg (has no administration in time range)  dexamethasone (DECADRON) injection 10 mg (has no administration in time range)  doxycycline (VIBRAMYCIN) 100 mg in sodium chloride 0.9 % 250 mL IVPB (has no administration in time range)  cefTRIAXone (ROCEPHIN) 1 g in sodium chloride 0.9 % 100 mL IVPB (has no administration in time range)  sodium chloride 0.9 % bolus 1,000 mL (1,000 mLs Intravenous New Bag/Given 05/05/22 0302)    ED Course/ Medical Decision Making/ A&P                           Medical Decision Making Amount and/or Complexity of Data Reviewed Labs: ordered. Radiology: ordered.  Risk OTC drugs. Prescription drug management. Decision regarding hospitalization.   This patient presents to the ED for concern of headache, this involves a number of treatment options, and is a complaint that carries with it a high risk of complications and morbidity.  The differential diagnosis includes meningitis, encephalitis, and headache related to URI.   Co morbidities: Discussed in HPI    EMR reviewed including pt PMHx, past surgical history and past visits to ER.   See HPI for more details   Lab  Tests:   I ordered and independently interpreted labs. Labs notable for mild hyperglycemia   Imaging Studies:  NAD. I personally reviewed all imaging studies and no acute abnormality found. I agree with radiology interpretation.    Cardiac Monitoring:  NA EKG non-ischemic   Medicines ordered:  I ordered medication including doxycyline and rocephin for empiric coverage in setting of meningitis r/o Reevaluation of the patient after these medicines showed that the patient stayed the same I have reviewed the patients home medicines and have made adjustments as needed   Critical Interventions:  Lumbar puncture for rule out bacterial vs viral meningitis  30 minutes of critical care time were utilized in the management of this patient   Consults/Attending Physician   I requested consultation with ID,  and discussed lab and imaging findings as well as pertinent plan - they recommend: restart doxycyline    Reevaluation:  After the interventions noted above I re-evaluated patient and found that they have :stayed the same   Social Determinants of Health:  The patient's social determinants of health were not a factor in the care of this patient    Problem List / ED Course: Patient presented with headache, neck stiffness, and fever. In setting recent RMSF infection, could not rule out meningitis/encephalitis without further investigation. First, ruled out other sources of fever: negative for covid and flu A and B per Resp PCR panel. CXR was unremarkable. CT scan also negative. Reached out to ID who recommended to restart Doxy for empiric abx coverage. Also started rocephin. Lumbar puncture was also conducted and appropriate CSF labs were sent. Opening pressure was slighlty elevated but CSR was clear in color. Admitted to hospital team for ongoing management of r/o meningitis.   Dispostion:  After consideration of the diagnostic results and the patients response to treatment,  I feel that the patent would benefit from admission to the hospital for ongoing meningitis r/o.          Final Clinical Impression(s) / ED Diagnoses Final diagnoses:  Intractable headache, unspecified chronicity pattern, unspecified headache type  Fever, unspecified fever cause    Rx / DC Orders ED Discharge Orders     None  Gareth Eagle, PA-C 05/05/22 0433    Gareth Eagle, PA-C 05/05/22 0443    Gareth Eagle, PA-C 05/05/22 0538    Nira Conn, MD 05/05/22 [phone]

## 2022-05-05 NOTE — Consult Note (Signed)
[facility]  Total days of antibiotics 1       Reason for Consult: headache and fever    Referring Physician: Toniann Fail  Principal Problem:   Headache    HPI: Trevor White is a 31 y.o. male with hx of having tick bite to torso in mid-late June was attached for less than 24hr was seen at this PCP office in late June and early July for symptoms of fever, headache with concern for RMSF. He was started on 3 wk course of doxy 100mg  bid on July 6th. 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 He took the full course of antibiotics without difficulty. He may have had sunburn to hands due to SE of doxycycline. He reports intermittent headache since completing course of  abtx. He finished course 10 days ago but then started to have new onset fever of 104F in the setting of worsening headache--which brought him to the ED. He underwent LP that showed 0 WBC, nl glu, nl protein, negative gram stain. He was ruled out for covid/flu. LAbs do not show any thrombocytopenia/leukopenia. CXR no infiltrates per my read. Was initially started on vanco/ceftriaxone which has now been discontinued. He feels improved, and is relieved to hear that his tests are normal.  History reviewed. No pertinent past medical history.  Allergies:  Allergies  Allergen Reactions   Diclofenac Sodium Itching    Pennsaid "pump".  Pennsaid "pump".  Pennsaid "pump".        MEDICATIONS:   Social History   Tobacco Use   Smoking status: Never    Family History  Problem Relation Age of Onset   Stroke Paternal Uncle      Review of Systems  Constitutional: Negative for fever, chills, diaphoresis, activity change, appetite change, fatigue and unexpected weight change.  HENT: Negative for congestion, sore throat, rhinorrhea, sneezing, trouble swallowing and sinus pressure.  Eyes: Negative for photophobia and visual disturbance.  Respiratory: Negative for cough, chest tightness,  shortness of breath, wheezing and stridor.  Cardiovascular: Negative for chest pain, palpitations and leg swelling.  Gastrointestinal: Negative for nausea, vomiting, abdominal pain, diarrhea, constipation, blood in stool, abdominal distention and anal bleeding.  Genitourinary: Negative for dysuria, hematuria, flank pain and difficulty urinating.  Musculoskeletal: Negative for myalgias, back pain, joint swelling, arthralgias and gait problem.  Skin: Negative for color change, pallor, rash and wound.  Neurological: + headache. Negative for dizziness, tremors, weakness and light-headedness.  Hematological: Negative for adenopathy. Does not bruise/bleed easily.  Psychiatric/Behavioral: Negative for behavioral problems, confusion, sleep disturbance, dysphoric mood, decreased concentration and agitation.    OBJECTIVE: Temp:  [98.2 F (36.8 C)-99 F (37.2 C)] 98.3 F (36.8 C) (08/10 1521) Pulse Rate:  [72-111] 72 (08/10 1521) Resp:  [13-20] 16 (08/10 1521) BP: (97-124)/(54-66) 104/59 (08/10 1523) SpO2:  [100 %] 100 % (08/10 1521) Weight:  [59.4 kg] 59.4 kg (08/10 0600) Physical Exam  Constitutional: He is oriented to person, place, and time. He appears well-developed and well-nourished. No distress.  HENT:  Mouth/Throat: Oropharynx is clear and moist. No oropharyngeal exudate.  Cardiovascular: Normal rate, regular rhythm and normal heart sounds. Exam reveals no gallop and no friction rub.  No murmur heard.  Pulmonary/Chest: Effort normal and breath sounds normal. No respiratory distress. He has no wheezes.  Abdominal: Soft. Bowel sounds are normal. He exhibits no distension. There is no tenderness.  Lymphadenopathy:  He has no cervical adenopathy.  Neurological: He is alert and oriented  to person, place, and time.  Skin: Skin is warm and dry. No rash noted. No erythema.  Psychiatric: He has a normal mood and affect. His behavior is normal.    LABS: Results for orders placed or performed  during the hospital encounter of 05/04/22 (from the past 48 hour(s))  CBC with Differential     Status: Abnormal   Collection Time: 05/04/22  5:30 PM  Result Value Ref Range   WBC 8.9 4.0 - 10.5 K/uL   RBC 5.05 4.22 - 5.81 MIL/uL   Hemoglobin 14.1 13.0 - 17.0 g/dL   HCT 63.1 49.7 - 02.6 %   MCV 83.0 80.0 - 100.0 fL   MCH 27.9 26.0 - 34.0 pg   MCHC 33.7 30.0 - 36.0 g/dL   RDW 37.8 58.8 - 50.2 %   Platelets 207 150 - 400 K/uL   nRBC 0.0 0.0 - 0.2 %   Neutrophils Relative % 87 %   Neutro Abs 7.7 1.7 - 7.7 K/uL   Lymphocytes Relative 6 %   Lymphs Abs 0.5 (L) 0.7 - 4.0 K/uL   Monocytes Relative 6 %   Monocytes Absolute 0.6 0.1 - 1.0 K/uL   Eosinophils Relative 0 %   Eosinophils Absolute 0.0 0.0 - 0.5 K/uL   Basophils Relative 0 %   Basophils Absolute 0.0 0.0 - 0.1 K/uL   Immature Granulocytes 1 %   Abs Immature Granulocytes 0.04 0.00 - 0.07 K/uL    Comment: Performed at [facility], [address] [place], [place] [address]  Comprehensive metabolic panel     Status: Abnormal   Collection Time: 05/04/22  5:30 PM  Result Value Ref Range   Sodium 137 135 - 145 mmol/L   Potassium 3.8 3.5 - 5.1 mmol/L   Chloride 103 98 - 111 mmol/L   CO2 25 22 - 32 mmol/L   Glucose, Bld 112 (H) 70 - 99 mg/dL    Comment: Glucose reference range applies only to samples taken after fasting for at least 8 hours.   BUN 10 6 - 20 mg/dL   Creatinine, Ser 8.78 0.61 - 1.24 mg/dL   Calcium 9.5 8.9 - 67.6 mg/dL   Total Protein 7.4 6.5 - 8.1 g/dL   Albumin 4.3 3.5 - 5.0 g/dL   AST 21 15 - 41 U/L   ALT 19 0 - 44 U/L   Alkaline Phosphatase 56 38 - 126 U/L   Total Bilirubin 0.9 0.3 - 1.2 mg/dL   GFR, Estimated >72 >09 mL/min    Comment: (NOTE) Calculated using the CKD-EPI Creatinine Equation (2021)    Anion gap 9 5 - 15    Comment: Performed at [facility], [address], [place], [place] [address]  Lipase, blood     Status: None   Collection Time: 05/04/22  5:30 PM  Result Value Ref Range    Lipase 27 11 - 51 U/L    Comment: Performed at [facility], [address] [place], [place] [address]  Resp Panel by RT-PCR (Flu A&B, Covid) Anterior Nasal Swab     Status: None   Collection Time: 05/05/22 12:35 AM   Specimen: Anterior Nasal Swab  Result Value Ref Range   SARS Coronavirus 2 by RT PCR NEGATIVE NEGATIVE    Comment: (NOTE) SARS-CoV-2 target nucleic acids are NOT DETECTED.  The SARS-CoV-2 RNA is generally detectable in upper respiratory specimens during the acute phase of infection. The lowest concentration of SARS-CoV-2 viral copies this  assay can detect is 138 copies/mL. A negative result does not preclude SARS-Cov-2 infection and should not be used as the sole basis for treatment or other patient management decisions. A negative result may occur with  improper specimen collection/handling, submission of specimen other than nasopharyngeal swab, presence of viral mutation(s) within the areas targeted by this assay, and inadequate number of viral copies(<138 copies/mL). A negative result must be combined with clinical observations, patient history, and epidemiological information. The expected result is Negative.  Fact Sheet for Patients:  BloggerCourse.com  Fact Sheet for Healthcare Providers:  SeriousBroker.it  This test is no t yet approved or cleared by the [place] FDA and  has been authorized for detection and/or diagnosis of SARS-CoV-2 by FDA under an Emergency Use Authorization (EUA). This EUA will remain  in effect (meaning this test can be used) for the duration of the COVID-19 declaration under Section 564(b)(1) of the Act, 21 U.S.C.section 360bbb-3(b)(1), unless the authorization is terminated  or revoked sooner.       Influenza A by PCR NEGATIVE NEGATIVE   Influenza B by PCR NEGATIVE NEGATIVE    Comment: (NOTE) The Xpert Xpress SARS-CoV-2/FLU/RSV plus assay is intended as an aid in  the diagnosis of influenza from Nasopharyngeal swab specimens and should not be used as a sole basis for treatment. Nasal washings and aspirates are unacceptable for Xpert Xpress SARS-CoV-2/FLU/RSV testing.  Fact Sheet for Patients: BloggerCourse.com  Fact Sheet for Healthcare Providers: SeriousBroker.it  This test is not yet approved or cleared by the [place] FDA and has been authorized for detection and/or diagnosis of SARS-CoV-2 by FDA under an Emergency Use Authorization (EUA). This EUA will remain in effect (meaning this test can be used) for the duration of the COVID-19 declaration under Section 564(b)(1) of the Act, 21 U.S.C. section 360bbb-3(b)(1), unless the authorization is terminated or revoked.  Performed at [facility], [address], [place], [place] [address]   CSF cell count with differential     Status: Abnormal   Collection Time: 05/05/22  2:15 AM  Result Value Ref Range   Tube # 1    Color, CSF COLORLESS COLORLESS   Appearance, CSF CLEAR (A) CLEAR   Supernatant NOT INDICATED    RBC Count, CSF 2 (H) 0 /cu mm   Other Cells, CSF TOO FEW TO COUNT, SMEAR AVAILABLE FOR REVIEW     Comment: FEW MONONUCLEAR CELLS  Performed at [facility], [address] [place], [place] [address]   Protein and glucose, CSF     Status: Abnormal   Collection Time: 05/05/22  2:15 AM  Result Value Ref Range   Glucose, CSF 74 (H) 40 - 70 mg/dL   Total  Protein, CSF 32 15 - 45 mg/dL    Comment: Performed at [facility], [address], [place], [place] [address]  Cryptococcal antigen, CSF     Status: None   Collection Time: 05/05/22  2:15 AM  Result Value Ref Range   Crypto Ag NEGATIVE NEGATIVE   Cryptococcal Ag Titer NOT INDICATED NOT INDICATED    Comment: Performed at [facility], [address] [place], [place] [address]  HIV Antibody (routine testing w rflx)     Status: None   Collection Time:  05/05/22  2:55 AM  Result Value Ref Range   HIV Screen 4th Generation wRfx Non Reactive Non Reactive    Comment: Performed at [facility], [address],  [place], [place] [address]  CBC with Differential/Platelet     Status: None   Collection Time: 05/05/22  2:55 AM  Result Value Ref Range   WBC 7.5 4.0 - 10.5 K/uL   RBC 5.16 4.22 - 5.81 MIL/uL   Hemoglobin 14.3 13.0 - 17.0 g/dL   HCT 67.3 41.9 - 37.9 %   MCV 83.1 80.0 - 100.0 fL   MCH 27.7 26.0 - 34.0 pg   MCHC 33.3 30.0 - 36.0 g/dL   RDW 02.4 09.7 - 35.3 %   Platelets 219 150 - 400 K/uL   nRBC 0.0 0.0 - 0.2 %   Neutrophils Relative % 82 %   Neutro Abs 6.1 1.7 - 7.7 K/uL   Lymphocytes Relative 10 %   Lymphs Abs 0.8 0.7 - 4.0 K/uL   Monocytes Relative 8 %   Monocytes Absolute 0.6 0.1 - 1.0 K/uL   Eosinophils Relative 0 %   Eosinophils Absolute 0.0 0.0 - 0.5 K/uL   Basophils Relative 0 %   Basophils Absolute 0.0 0.0 - 0.1 K/uL   Immature Granulocytes 0 %   Abs Immature Granulocytes 0.03 0.00 - 0.07 K/uL    Comment: Performed at [facility], [address] [place], [place] [address]  Basic metabolic panel     Status: Abnormal   Collection Time: 05/05/22  2:55 AM  Result Value Ref Range   Sodium 135 135 - 145 mmol/L   Potassium 3.3 (L) 3.5 - 5.1 mmol/L   Chloride 102 98 - 111 mmol/L   CO2 24 22 - 32 mmol/L   Glucose, Bld 119 (H) 70 - 99 mg/dL    Comment: Glucose reference range applies only to samples taken after fasting for at least 8 hours.   BUN 10 6 - 20 mg/dL   Creatinine, Ser 2.68 0.61 - 1.24 mg/dL   Calcium 9.4 8.9 - 34.1 mg/dL   GFR, Estimated >96 >22 mL/min    Comment: (NOTE) Calculated using the CKD-EPI Creatinine Equation (2021)    Anion gap 9 5 - 15    Comment: Performed at [facility], [address] [place], [place] [address]  Hepatic function panel     Status: None   Collection Time: 05/05/22  2:55 AM  Result Value Ref Range   Total Protein 7.6 6.5 - 8.1 g/dL   Albumin 4.3 3.5 -  5.0 g/dL   AST 22 15 - 41 U/L   ALT 18 0 - 44 U/L   Alkaline Phosphatase 59 38 - 126 U/L   Total Bilirubin 0.6 0.3 - 1.2 mg/dL   Bilirubin, Direct 0.1 0.0 - 0.2 mg/dL   Indirect Bilirubin 0.5 0.3 - 0.9 mg/dL    Comment: Performed at [facility], [address] [place], [place] [address]  CSF culture w Gram Stain     Status: None (Preliminary result)   Collection Time: 05/05/22  4:00 AM   Specimen: CSF; Cerebrospinal Fluid  Result Value Ref Range   Specimen Description CSF    Special Requests LP    Gram Stain      NO ORGANISMS SEEN NO WBC SEEN Performed at [facility], [address], [place], [place] [address]    Culture PENDING    Report Status PENDING     MICRO: reviewed IMAGING: DG Chest 2 View  Result Date: 05/05/2022 CLINICAL DATA:  Fever, URI symptoms EXAM: CHEST - 2 VIEW COMPARISON:  04/27/2021 FINDINGS: The heart size and mediastinal contours  are within normal limits. Both lungs are clear. The visualized skeletal structures are unremarkable. IMPRESSION: Normal study. Electronically Signed   By: Charlett NoseKevin  Dover M.D.   On: 05/05/2022 01:00   CT HEAD WO CONTRAST (5MM)  Result Date: 05/04/2022 CLINICAL DATA:  Headache, new or worsening, positional (Age 818-49y) EXAM: CT HEAD WITHOUT CONTRAST TECHNIQUE: Contiguous axial images were obtained from the base of the skull through the vertex without intravenous contrast. RADIATION DOSE REDUCTION: This exam was performed according to the departmental dose-optimization program which includes automated exposure control, adjustment of the mA and/or kV according to patient size and/or use of iterative reconstruction technique. COMPARISON:  None Available. FINDINGS: Brain: No intracranial hemorrhage, mass effect, or midline shift. No hydrocephalus. The basilar cisterns are patent. No evidence of territorial infarct or acute ischemia. No extra-axial or intracranial fluid collection. Vascular: No hyperdense vessel or unexpected  calcification. Skull: No fracture or focal lesion. Sinuses/Orbits: Paranasal sinuses and mastoid air cells are clear. The visualized orbits are unremarkable. Other: None. IMPRESSION: Negative noncontrast head CT. Electronically Signed   By: Narda RutherfordMelanie  Sanford M.D.   On: 05/04/2022 23:42    HISTORICAL MICRO/IMAGING  Assessment/Plan:  31yo M with new onset fever and worsening headache, concerm for aseptic/viral meningitis, with lumbar puncture showing no pleocytosis. - continue to monitor off of abtx - would benefit with the continued supportive care - RMSF, lyme, and erlichia serology sent but appears to have completed full course of treatment and does not appear to have signs of ongoing disease presently - if improved over next 24hr, can be discharged and have him follow up with PCP  Aram Beechamynthia B. Drue SecondSnider MD MPH [facility] [phone]

## 2022-05-05 NOTE — H&P (Signed)
History and Physical    Trevor White GYK:[id number] DOB: 15-Apr-1991 DOA: 05/04/2022  PCP: [facility]  Patient coming from: Home.  Chief Complaint: Headache and fever.  HPI: Trevor White is a 31 y.o. 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  His initial symptoms with headache and fever and had gone to his primary care physician and had labs drawn which showed features concerning for RMSF and he had taken a total of 3 weeks of antibiotics.  Patient also had some rash in the periphery.  Reportedly on his upper extremity.  He was doing fine after his course of antibiotics when he started having headache for 3 days mostly the headaches on certain area of his scalp.  His head also hurts when he moves his head sideways.  Denies any photophobia has some neck pain.  Last night he started having a fever with chills rigors and diaphoresis.  He recorded a fever of 104 degrees at home and presents to the ER.  In addition patient also had nausea vomiting with some abdominal discomfort.  Denies any recent international travel or sick contacts.  Patient also recently followed with cardiology for palpitation and chest pain had stress test done.  ED Course: In the ER patient's temperature was 99 F.  CT head was unremarkable.  ER physician discussed with on-call infectious disease consultant and had a lumbar puncture done blood cultures drawn and started on IV doxycycline.  Patient admitted for further workup.  Patient was given 1 dose of Decadron.  Review of Systems: As per HPI, rest all negative.   History reviewed. No pertinent past medical history.  History reviewed. No pertinent surgical history.   reports that he has never smoked. He does not have any smokeless tobacco history on file. No history on file for alcohol use and drug use.  Allergies  Allergen Reactions    Diclofenac Sodium Itching    Pennsaid "pump".  Pennsaid "pump".  Pennsaid "pump".      Family History  Problem Relation Age of Onset   Stroke Paternal Uncle     Prior to Admission medications   Medication Sig Start Date End Date Taking? Authorizing Provider  Bacillus Coagulans-Inulin (ALIGN PREBIOTIC-PROBIOTIC) 5-1.25 MG-GM CHEW 3 (three) times a week.    [provider]  DOXYCYCLINE HYCLATE PO Take 100 mg by mouth 2 (two) times daily. Pt is instructed to take this medication BID for 14 days.    [provider]    Physical Exam: Constitutional: Moderately built and nourished. Vitals:   05/05/22 0435 05/05/22 0540 05/05/22 0600 05/05/22 0618  BP: 111/64 105/65  111/62  Pulse: 86 93  90  Resp: 13 13  15   Temp:  98.9 F (37.2 C)  98.6 F (37 C)  TempSrc:  Oral  Oral  SpO2: 100% 100%  100%  Weight:   59.4 kg   Height:   5\' 5"  (1.651 m)    Eyes: Anicteric no pallor. ENMT: No discharge from the ears eyes nose or mouth. Neck: No mass felt.  No neck rigidity. Respiratory: No rhonchi or crepitations. Cardiovascular: S1-S2 heard. Abdomen: Soft nontender bowel sounds present. Musculoskeletal: No edema. Skin: Macular rash on the both upper extremities. Neurologic: Alert awake oriented time place and person.  Moves all extremities.  No facial asymmetry.  Tongue is midline. Psychiatric: Appears normal.  Normal affect.   Labs on Admission:  I have personally reviewed following labs and imaging studies  CBC: Recent Labs  Lab 05/04/22 1730  WBC 8.9  NEUTROABS 7.7  HGB 14.1  HCT 41.9  MCV 83.0  PLT 207   Basic Metabolic Panel: Recent Labs  Lab 05/04/22 1730  NA 137  K 3.8  CL 103  CO2 25  GLUCOSE 112*  BUN 10  CREATININE 1.00  CALCIUM 9.5   GFR: Estimated Creatinine Clearance: 89.9 mL/min (by C-G formula based on SCr of 1 mg/dL). Liver Function Tests: Recent Labs  Lab 05/04/22 1730  AST 21  ALT 19  ALKPHOS 56  BILITOT 0.9  PROT 7.4   ALBUMIN 4.3   Recent Labs  Lab 05/04/22 1730  LIPASE 27   No results for input(s): "AMMONIA" in the last 168 hours. Coagulation Profile: No results for input(s): "INR", "PROTIME" in the last 168 hours. Cardiac Enzymes: No results for input(s): "CKTOTAL", "CKMB", "CKMBINDEX", "TROPONINI" in the last 168 hours. BNP (last 3 results) No results for input(s): "PROBNP" in the last 8760 hours. HbA1C: No results for input(s): "HGBA1C" in the last 72 hours. CBG: No results for input(s): "GLUCAP" in the last 168 hours. Lipid Profile: No results for input(s): "CHOL", "HDL", "LDLCALC", "TRIG", "CHOLHDL", "LDLDIRECT" in the last 72 hours. Thyroid Function Tests: No results for input(s): "TSH", "T4TOTAL", "FREET4", "T3FREE", "THYROIDAB" in the last 72 hours. Anemia Panel: No results for input(s): "VITAMINB12", "FOLATE", "FERRITIN", "TIBC", "IRON", "RETICCTPCT" in the last 72 hours. Urine analysis:    Component Value Date/Time   COLORURINE YELLOW 05/28/2021 1114   APPEARANCEUR CLOUDY (A) 05/28/2021 1114   LABSPEC 1.020 05/28/2021 1114   PHURINE 8.0 05/28/2021 1114   GLUCOSEU NEGATIVE 05/28/2021 1114   HGBUR NEGATIVE 05/28/2021 1114   BILIRUBINUR NEGATIVE 05/28/2021 1114   KETONESUR NEGATIVE 05/28/2021 1114   PROTEINUR NEGATIVE 05/28/2021 1114   NITRITE NEGATIVE 05/28/2021 1114   LEUKOCYTESUR NEGATIVE 05/28/2021 1114   Sepsis Labs: @LABRCNTIP (procalcitonin:4,lacticidven:4) ) Recent Results (from the past 240 hour(s))  Resp Panel by RT-PCR (Flu A&B, Covid) Anterior Nasal Swab     Status: None   Collection Time: 05/05/22 12:35 AM   Specimen: Anterior Nasal Swab  Result Value Ref Range Status   SARS Coronavirus 2 by RT PCR NEGATIVE NEGATIVE Final    Comment: (NOTE) SARS-CoV-2 target nucleic acids are NOT DETECTED.  The SARS-CoV-2 RNA is generally detectable in upper respiratory specimens during the acute phase of infection. The lowest concentration of SARS-CoV-2 viral copies this  assay can detect is 138 copies/mL. A negative result does not preclude SARS-Cov-2 infection and should not be used as the sole basis for treatment or other patient management decisions. A negative result may occur with  improper specimen collection/handling, submission of specimen other than nasopharyngeal swab, presence of viral mutation(s) within the areas targeted by this assay, and inadequate number of viral copies(<138 copies/mL). A negative result must be combined with clinical observations, patient history, and epidemiological information. The expected result is Negative.  Fact Sheet for Patients:  07/05/22  Fact Sheet for Healthcare Providers:  BloggerCourse.com  This test is no t yet approved or cleared by the [place] FDA and  has been authorized for detection and/or diagnosis of SARS-CoV-2 by FDA under an Emergency Use Authorization (EUA). This EUA will remain  in effect (meaning this test can be used) for the duration of the COVID-19 declaration under Section 564(b)(1) of the Act, 21 U.S.C.section 360bbb-3(b)(1), unless the authorization is terminated  or revoked sooner.  Influenza A by PCR NEGATIVE NEGATIVE Final   Influenza B by PCR NEGATIVE NEGATIVE Final    Comment: (NOTE) The Xpert Xpress SARS-CoV-2/FLU/RSV plus assay is intended as an aid in the diagnosis of influenza from Nasopharyngeal swab specimens and should not be used as a sole basis for treatment. Nasal washings and aspirates are unacceptable for Xpert Xpress SARS-CoV-2/FLU/RSV testing.  Fact Sheet for Patients: BloggerCourse.com  Fact Sheet for Healthcare Providers: SeriousBroker.it  This test is not yet approved or cleared by the [place] FDA and has been authorized for detection and/or diagnosis of SARS-CoV-2 by FDA under an Emergency Use Authorization (EUA). This EUA will  remain in effect (meaning this test can be used) for the duration of the COVID-19 declaration under Section 564(b)(1) of the Act, 21 U.S.C. section 360bbb-3(b)(1), unless the authorization is terminated or revoked.  Performed at [facility], [address] [place], [place] [address]   CSF culture w Gram Stain     Status: None (Preliminary result)   Collection Time: 05/05/22  4:00 AM   Specimen: CSF; Cerebrospinal Fluid  Result Value Ref Range Status   Specimen Description CSF  Final   Special Requests LP  Final   Gram Stain   Final    NO ORGANISMS SEEN NO WBC SEEN Performed at [facility], [address] [place], [place] [address]    Culture PENDING  Incomplete   Report Status PENDING  Incomplete     Radiological Exams on Admission: DG Chest 2 View  Result Date: 05/05/2022 CLINICAL DATA:  Fever, URI symptoms EXAM: CHEST - 2 VIEW COMPARISON:  04/27/2021 FINDINGS: The heart size and mediastinal contours are within normal limits. Both lungs are clear. The visualized skeletal structures are unremarkable. IMPRESSION: Normal study. Electronically Signed   By: Charlett Nose M.D.   On: 05/05/2022 01:00   CT HEAD WO CONTRAST ( )  Result Date: 05/04/2022 CLINICAL DATA:  Headache, new or worsening, positional (Age 11-49y) EXAM: CT HEAD WITHOUT CONTRAST TECHNIQUE: Contiguous axial images were obtained from the base of the skull through the vertex without intravenous contrast. RADIATION DOSE REDUCTION: This exam was performed according to the departmental dose-optimization program which includes automated exposure control, adjustment of the mA and/or kV according to patient size and/or use of iterative reconstruction technique. COMPARISON:  None Available. FINDINGS: Brain: No intracranial hemorrhage, mass effect, or midline shift. No hydrocephalus. The basilar cisterns are patent. No evidence of territorial infarct or acute ischemia. No extra-axial or intracranial fluid collection.  Vascular: No hyperdense vessel or unexpected calcification. Skull: No fracture or focal lesion. Sinuses/Orbits: Paranasal sinuses and mastoid air cells are clear. The visualized orbits are unremarkable. Other: None. IMPRESSION: Negative noncontrast head CT. Electronically Signed   By: Narda Rutherford M.D.   On: 05/04/2022 23:42      Assessment/Plan Principal Problem:   Headache    Headache with recent treatment for [facility] spotted fever -ER physician had discussed with on-call infectious disease consultant Dr. Elinor Parkinson.  Dr. Elinor Parkinson requested getting a [facility] spotted fever IgG and IgM sent.  And pending lumbar puncture labs and blood cultures to be followed.  Will continue on doxycycline IV for now.  Further recommendations per infectious disease. Recently was seen by cardiologist for chest pain and palpitation and had a stress test done.  On April 21, 2022.  2D echo done at the same time showed EF of 60 to 65%.   DVT prophylaxis: SCDs.  Avoiding anticoagulation in the  setting of recent lumbar puncture and may need more procedures. Code Status: Full code. Family Communication: Wife at the bedside. Disposition Plan: Home when stable. Consults called: ER physician discussed with infectious disease consultant. Admission status: Observation.   Eduard Clos MD [facility] Pager [phone].  If 7PM-7AM, please contact night-coverage [email or web address] Password [facility]  05/05/2022, 6:49 AM

## 2022-05-05 NOTE — Progress Notes (Signed)
   Day ZERO NOTE   Trevor White  YYQ:[id number] DOB: Jun 03, 1991 DOA: 05/04/2022 PCP: [facility]   Brief Narrative:  Evaluated earlier this morning by my colleague. Please see H/P this am for further details.  Briefly:  Trevor White is a 31 y.o. 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 Noted to have worsening headache and given recent RMSF infection LP was taken in ED to evaluate for possible meningitis.    Objective: Vitals:   05/05/22 0435 05/05/22 0540 05/05/22 0600 05/05/22 0618  BP: 111/64 105/65  111/62  Pulse: 86 93  90  Resp: 13 13  15   Temp:  98.9 F (37.2 C)  98.6 F (37 C)  TempSrc:  Oral  Oral  SpO2: 100% 100%  100%  Weight:   59.4 kg   Height:   5\' 5"  (1.651 m)    Scheduled Meds: Continuous Infusions:  doxycycline (VIBRAMYCIN) IV     lactated ringers       LOS: 0 days   Time spent:  , DO [facility]  If 7PM-7AM, please contact night-coverage [email or web address]  05/05/2022, 7:44 AM

## 2022-05-05 NOTE — Progress Notes (Signed)
  Transition of Care [facility]) Screening Note   Patient Details  Name: Trevor White Date of Birth: [birth year]   Transition of Care [facility]) CM/SW Contact:    Kermit Balo, RN Phone Number: 05/05/2022, 2:23 PM   Pt from home with spouse.  Transition of Care Department [facility]) has reviewed patient. We will continue to monitor patient advancement through interdisciplinary progression rounds. If new patient transition needs arise, please place a TOC consult.

## 2022-05-05 NOTE — ED Notes (Signed)
Pt transported to xray 

## 2022-05-05 NOTE — ED Notes (Signed)
Dr. Cardama at bedside.  

## 2022-05-06 DIAGNOSIS — G4459 Other complicated headache syndrome: Secondary | ICD-10-CM | POA: Diagnosis not present

## 2022-05-06 LAB — CBC
HCT: 36.5 % — ABNORMAL LOW (ref 39.0–52.0)
Hemoglobin: 12 g/dL — ABNORMAL LOW (ref 13.0–17.0)
MCH: 27.8 pg (ref 26.0–34.0)
MCHC: 32.9 g/dL (ref 30.0–36.0)
MCV: 84.5 fL (ref 80.0–100.0)
Platelets: 198 10*3/uL (ref 150–400)
RBC: 4.32 MIL/uL (ref 4.22–5.81)
RDW: 12.9 % (ref 11.5–15.5)
WBC: 8.7 10*3/uL (ref 4.0–10.5)
nRBC: 0 % (ref 0.0–0.2)

## 2022-05-06 LAB — COMPREHENSIVE METABOLIC PANEL
ALT: 15 U/L (ref 0–44)
AST: 13 U/L — ABNORMAL LOW (ref 15–41)
Albumin: 3.1 g/dL — ABNORMAL LOW (ref 3.5–5.0)
Alkaline Phosphatase: 40 U/L (ref 38–126)
Anion gap: 4 — ABNORMAL LOW (ref 5–15)
BUN: 11 mg/dL (ref 6–20)
CO2: 26 mmol/L (ref 22–32)
Calcium: 8.6 mg/dL — ABNORMAL LOW (ref 8.9–10.3)
Chloride: 110 mmol/L (ref 98–111)
Creatinine, Ser: 0.86 mg/dL (ref 0.61–1.24)
GFR, Estimated: >60 mL/min (ref >60)
Glucose, Bld: 123 mg/dL — ABNORMAL HIGH (ref 70–99)
Potassium: 4.1 mmol/L (ref 3.5–5.1)
Sodium: 140 mmol/L (ref 135–145)
Total Bilirubin: 0.5 mg/dL (ref 0.3–1.2)
Total Protein: 5.6 g/dL — ABNORMAL LOW (ref 6.5–8.1)

## 2022-05-06 LAB — ROCKY MTN SPOTTED FVR ABS PNL(IGG+IGM)
RMSF IgG: NEGATIVE
RMSF IgM: 3.81 index — ABNORMAL HIGH (ref 0.00–0.89)

## 2022-05-06 LAB — HSV 1/2 PCR, CSF
HSV-1 DNA: NEGATIVE
HSV-2 DNA: NEGATIVE

## 2022-05-06 LAB — VDRL, CSF: VDRL Quant, CSF: NONREACTIVE

## 2022-05-06 NOTE — Discharge Summary (Signed)
Physician Discharge Summary  Trevor SaucerJonathan Nakatani ZOX:[id number] DOB: 12/01/1990 DOA: 05/04/2022  PCP: Alvia Groveidge, [facility]  Admit date: 05/04/2022 Discharge date: 05/06/2022  Admitted From: Home Disposition: Home  Recommendations for Outpatient Follow-up:  Follow up with PCP in 1-2 weeks  Home Health: None Equipment/Devices: None  Discharge Condition: Stable CODE STATUS: Full Diet recommendation: Regular diet  Brief/Interim Summary: Trevor White is a 31 y.o. male with no significant past medical history was [facility] and was on antibiotic doxycycline for 3 weeks.  His initial symptoms with headache and fever and had gone to his primary care physician and had labs drawn which showed features concerning for RMSF and he had taken a total of 3 weeks of antibiotics.   Patient admitted as above with ongoing headache, peripheral rash over his upper extremities on the posterior aspect of his metatarsals and proximal fingers.  Given recent concern for [facility] fever and headache as well as questionable neck pain he was admitted for work-up for questionable meningitis.  Patient's labs, imaging and infectious work-up have been wholly unremarkable.  Infectious disease was consulted given patient's presentation and concerns, given he is already completed 3 weeks of doxycycline with negative LP and cultures we will discontinue antibiotics and he is otherwise stable and agreeable for discharge home.  We discussed his headache was likely tension based given its reproduction with flexion of the neck, his questionable peripheral rash may have been some light sensitivity in the setting of doxycycline.  Otherwise at this time he is back to baseline without any further deficits or issues, headache ongoing but markedly more mild than previous.  Discussed over-the-counter NSAIDs heat ice and stretching for his neck over the next week.  If he has worsening  symptoms, photophobia visual field deficits, weakness unilaterally or high-grade fever we discussed presenting back to his primary care office or the hospital for further work-up.  Discharge Diagnoses:  Principal Problem:   Headache    Discharge Instructions   Allergies as of 05/06/2022       Reactions   Diclofenac Sodium Itching   Pennsaid "pump".  Pennsaid "pump".  Pennsaid "pump".         Medication List     STOP taking these medications    DOXYCYCLINE HYCLATE PO       TAKE these medications    Align Prebiotic-Probiotic 5-1.25 MG-GM Chew Chew 1 tablet by mouth daily.   OVER THE COUNTER MEDICATION Take 238 mLs by mouth daily as needed (digestive). Kambucha   triamcinolone cream 0.5 % Commonly known as: KENALOG Apply 1 Application topically daily as needed for rash.        Allergies  Allergen Reactions   Diclofenac Sodium Itching    Pennsaid "pump".  Pennsaid "pump".  Pennsaid "pump".      Consultations: Infectious disease  Procedures/Studies: DG Chest 2 View  Result Date: 05/05/2022 CLINICAL DATA:  Fever, URI symptoms EXAM: CHEST - 2 VIEW COMPARISON:  04/27/2021 FINDINGS: The heart size and mediastinal contours are within normal limits. Both lungs are clear. The visualized skeletal structures are unremarkable. IMPRESSION: Normal study. Electronically Signed   By: Charlett NoseKevin  Dover M.D.   On: 05/05/2022 01:00   CT HEAD WO CONTRAST (5MM)  Result Date: 05/04/2022 CLINICAL DATA:  Headache, new or worsening, positional (Age 31-49y) EXAM: CT HEAD WITHOUT CONTRAST TECHNIQUE: Contiguous axial images were obtained from the base of the skull through the vertex without intravenous contrast. RADIATION DOSE REDUCTION:  This exam was performed according to the departmental dose-optimization program which includes automated exposure control, adjustment of the mA and/or kV according to patient size and/or use of iterative reconstruction technique. COMPARISON:  None  Available. FINDINGS: Brain: No intracranial hemorrhage, mass effect, or midline shift. No hydrocephalus. The basilar cisterns are patent. No evidence of territorial infarct or acute ischemia. No extra-axial or intracranial fluid collection. Vascular: No hyperdense vessel or unexpected calcification. Skull: No fracture or focal lesion. Sinuses/Orbits: Paranasal sinuses and mastoid air cells are clear. The visualized orbits are unremarkable. Other: None. IMPRESSION: Negative noncontrast head CT. Electronically Signed   By: Narda Rutherford M.D.   On: 05/04/2022 23:42   LONG TERM MONITOR (3-14 DAYS)  Result Date: 04/25/2022 Patch Wear Time:  4 days and 1 hours (2023-07-18T21:14:47-0400 to 2023-07-22T22:48:33-0400) Patient had a min HR of 37 bpm, max HR of 167 bpm, and avg HR of 69 bpm. Predominant underlying rhythm was Sinus Rhythm. EVENTSL Isolated SVEs were rare (<1.0%), SVE Couplets were rare (<1.0%), and SVE Triplets were rare (<1.0%). Isolated VEs were rare (<1.0%), and no VE Couplets or VE Triplets were present. No atrial fibrillation, ventricular tachyarrhythmias, or bradyarrhythmias were detected. Patient-triggered events corresponded with sinus rhythm, PVCs, and PACs.   EXERCISE TOLERANCE TEST (ETT)  Result Date: 04/21/2022   No ST deviation was noted. Normal ETT Excellent exercise capacity Normal hemodynamic respnse No significant arrhythmias   ECHOCARDIOGRAM COMPLETE  Result Date: 04/21/2022    ECHOCARDIOGRAM REPORT   Patient Name:   Trevor White Date of Exam: 04/21/2022 Medical Rec #:  [id number]     Height:       66.0 in Accession #:    [id number]    Weight:       126.6 lb Date of Birth:  01-09-1991      BSA:          1.647 m Patient Age:    31 years      BP:           125/49 mmHg Patient Gender: M             HR:           73 bpm. Exam Location:  [address] Procedure: 2D Echo, 3D Echo, Cardiac Doppler and Color Doppler Indications:    R07.9 Chest Pain  History:        Patient has no prior  history of Echocardiogram examinations.                 Signs/Symptoms:Palpitations. No cardiac history.  Sonographer:    Clearence Ped RCS Referring Phys: [id number] Orbie Pyo IMPRESSIONS  1. Left ventricular ejection fraction, by estimation, is 60 to 65%. The left ventricle has normal function. The left ventricle has no regional Allbaugh motion abnormalities. Left ventricular diastolic parameters were normal.  2. Right ventricular systolic function is normal. The right ventricular size is normal. There is normal pulmonary artery systolic pressure.  3. The mitral valve is normal in structure. Mild mitral valve regurgitation. No evidence of mitral stenosis.  4. The aortic valve is normal in structure. Aortic valve regurgitation is not visualized. No aortic stenosis is present.  5. The inferior vena cava is normal in size with greater than 50% respiratory variability, suggesting right atrial pressure of 3 mmHg. FINDINGS  Left Ventricle: Left ventricular ejection fraction, by estimation, is 60 to 65%. The left ventricle has normal function. The left ventricle has no regional Klecker motion abnormalities. The left ventricular internal cavity  size was normal in size. There is  no left ventricular hypertrophy. Left ventricular diastolic parameters were normal. Right Ventricle: The right ventricular size is normal. No increase in right ventricular Clippinger thickness. Right ventricular systolic function is normal. There is normal pulmonary artery systolic pressure. The tricuspid regurgitant velocity is 1.64 m/s, and  with an assumed right atrial pressure of 3 mmHg, the estimated right ventricular systolic pressure is 13.8 mmHg. Left Atrium: Left atrial size was normal in size. Right Atrium: Right atrial size was normal in size. Pericardium: There is no evidence of pericardial effusion. Mitral Valve: The mitral valve is normal in structure. Mild mitral valve regurgitation. No evidence of mitral valve stenosis. Tricuspid Valve: The  tricuspid valve is normal in structure. Tricuspid valve regurgitation is not demonstrated. No evidence of tricuspid stenosis. Aortic Valve: The aortic valve is normal in structure. Aortic valve regurgitation is not visualized. No aortic stenosis is present. Pulmonic Valve: The pulmonic valve was normal in structure. Pulmonic valve regurgitation is not visualized. No evidence of pulmonic stenosis. Aorta: The aortic root is normal in size and structure. Venous: The inferior vena cava is normal in size with greater than 50% respiratory variability, suggesting right atrial pressure of 3 mmHg. IAS/Shunts: No atrial level shunt detected by color flow Doppler.  LEFT VENTRICLE PLAX 2D LV IVS:        2.30 cm   Diastology LVOT diam:     2.00 cm   LV e' medial:    17.20 cm/s LV SV:         60        LV E/e' medial:  4.4 LV SV Index:   36        LV e' lateral:   15.10 cm/s LVOT Area:     3.14 cm  LV E/e' lateral: 5.0                           3D Volume EF:                          3D EF:        59 %                          LV EDV:       143 ml                          LV ESV:       59 ml                          LV SV:        84 ml RIGHT VENTRICLE RV Basal diam:  3.00 cm RV S prime:     18.20 cm/s TAPSE (M-mode): 2.5 cm RVSP:           13.8 mmHg LEFT ATRIUM             Index        RIGHT ATRIUM           Index LA Vol (A2C):   30.8 ml 18.71 ml/m  RA Pressure: 3.00 mmHg LA Vol (A4C):   26.5 ml 16.09 ml/m  RA Area:     8.35 cm LA Biplane Vol: 29.4 ml 17.86 ml/m  RA Volume:   16.20 ml  9.84 ml/m  AORTIC VALVE LVOT Vmax:   99.80 cm/s LVOT Vmean:  69.700 cm/s LVOT VTI:    0.191 m  AORTA Ao Asc diam: 2.40 cm MITRAL VALVE               TRICUSPID VALVE MV Area (PHT):             TR Peak grad:   10.8 mmHg MV Decel Time:             TR Vmax:        164.00 cm/s MV E velocity: 75.50 cm/s  Estimated RAP:  3.00 mmHg MV A velocity: 55.00 cm/s  RVSP:           13.8 mmHg MV E/A ratio:  1.37                            SHUNTS                             Systemic VTI:  0.19 m                            Systemic Diam: 2.00 cm Donato Schultz MD Electronically signed by Donato Schultz MD Signature Date/Time: 04/21/2022/2:40:34 PM    Final      Subjective: No acute issues or events overnight   Discharge Exam: Vitals:   05/06/22 0400 05/06/22 0818  BP: (!) 103/57 103/69  Pulse: 67 (!) 55  Resp: 15 20  Temp: 97.8 F (36.6 C) 97.9 F (36.6 C)  SpO2: 100% 100%   Vitals:   05/05/22 2035 05/05/22 2346 05/06/22 0400 05/06/22 0818  BP: 108/64 107/68 (!) 103/57 103/69  Pulse: 61 63 67 (!) 55  Resp: 17 18 15 20   Temp: 98.4 F (36.9 C) 98.4 F (36.9 C) 97.8 F (36.6 C) 97.9 F (36.6 C)  TempSrc: Oral Oral Oral Oral  SpO2: 100% 100% 100% 100%  Weight:   59.2 kg   Height:        General: Pt is alert, awake, not in acute distress Cardiovascular: RRR, S1/S2 +, no rubs, no gallops Respiratory: CTA bilaterally, no wheezing, no rhonchi Abdominal: Soft, NT, ND, bowel sounds + Extremities: no edema, no cyanosis    The results of significant diagnostics from this hospitalization (including imaging, microbiology, ancillary and laboratory) are listed below for reference.     Microbiology: Recent Results (from the past 240 hour(s))  Resp Panel by RT-PCR (Flu A&B, Covid) Anterior Nasal Swab     Status: None   Collection Time: 05/05/22 12:35 AM   Specimen: Anterior Nasal Swab  Result Value Ref Range Status   SARS Coronavirus 2 by RT PCR NEGATIVE NEGATIVE Final    Comment: (NOTE) SARS-CoV-2 target nucleic acids are NOT DETECTED.  The SARS-CoV-2 RNA is generally detectable in upper respiratory specimens during the acute phase of infection. The lowest concentration of SARS-CoV-2 viral copies this assay can detect is 138 copies/mL. A negative result does not preclude SARS-Cov-2 infection and should not be used as the sole basis for treatment or other patient management decisions. A negative result may occur with  improper specimen  collection/handling, submission of specimen other than nasopharyngeal swab, presence of viral mutation(s) within the areas targeted by this assay, and inadequate number of viral copies(<138 copies/mL). A negative result must be combined with clinical observations, patient  history, and epidemiological information. The expected result is Negative.  Fact Sheet for Patients:  BloggerCourse.com  Fact Sheet for Healthcare Providers:  SeriousBroker.it  This test is no t yet approved or cleared by the [place] FDA and  has been authorized for detection and/or diagnosis of SARS-CoV-2 by FDA under an Emergency Use Authorization (EUA). This EUA will remain  in effect (meaning this test can be used) for the duration of the COVID-19 declaration under Section 564(b)(1) of the Act, 21 U.S.C.section 360bbb-3(b)(1), unless the authorization is terminated  or revoked sooner.       Influenza A by PCR NEGATIVE NEGATIVE Final   Influenza B by PCR NEGATIVE NEGATIVE Final    Comment: (NOTE) The Xpert Xpress SARS-CoV-2/FLU/RSV plus assay is intended as an aid in the diagnosis of influenza from Nasopharyngeal swab specimens and should not be used as a sole basis for treatment. Nasal washings and aspirates are unacceptable for Xpert Xpress SARS-CoV-2/FLU/RSV testing.  Fact Sheet for Patients: BloggerCourse.com  Fact Sheet for Healthcare Providers: SeriousBroker.it  This test is not yet approved or cleared by the [place] FDA and has been authorized for detection and/or diagnosis of SARS-CoV-2 by FDA under an Emergency Use Authorization (EUA). This EUA will remain in effect (meaning this test can be used) for the duration of the COVID-19 declaration under Section 564(b)(1) of the Act, 21 U.S.C. section 360bbb-3(b)(1), unless the authorization is terminated or revoked.  Performed at [facility], [address], [place], [place] [address]   Blood culture (routine x 2)     Status: None (Preliminary result)   Collection Time: 05/05/22  2:55 AM   Specimen: BLOOD  Result Value Ref Range Status   Specimen Description BLOOD RIGHT ANTECUBITAL  Final   Special Requests   Final    BOTTLES DRAWN AEROBIC AND ANAEROBIC Blood Culture adequate volume   Culture   Final    NO GROWTH 1 DAY Performed at [facility], [address], [place], [place] [address]    Report Status PENDING  Incomplete  CSF culture w Gram Stain     Status: None (Preliminary result)   Collection Time: 05/05/22  4:00 AM   Specimen: CSF; Cerebrospinal Fluid  Result Value Ref Range Status   Specimen Description CSF  Final   Special Requests LP  Final   Gram Stain NO ORGANISMS SEEN NO WBC SEEN   Final   Culture   Final    NO GROWTH 1 DAY Performed at [facility], [address] [place], [place] [address]    Report Status PENDING  Incomplete  Blood culture (routine x 2)     Status: None (Preliminary result)   Collection Time: 05/05/22  4:45 AM   Specimen: BLOOD  Result Value Ref Range Status   Specimen Description BLOOD SITE NOT SPECIFIED  Final   Special Requests   Final    BOTTLES DRAWN AEROBIC AND ANAEROBIC Blood Culture results may not be optimal due to an excessive volume of blood received in culture bottles   Culture   Final    NO GROWTH 1 DAY Performed at [facility], [address] [place], [place] [address]    Report Status PENDING  Incomplete     Labs: BNP (last 3 results) No results for input(s): "BNP" in the last 8760 hours. Basic Metabolic Panel: Recent Labs  Lab 05/04/22 1730 05/05/22 0255 05/06/22 0305  NA 137 135 140  K 3.8  3.3* 4.1  CL 103 102 110  CO2 25 24 26   GLUCOSE 112* 119* 123*  BUN 10 10 11   CREATININE 1.00 1.06 0.86  CALCIUM 9.5 9.4 8.6*   Liver Function Tests: Recent Labs  Lab 05/04/22 1730 05/05/22 0255 05/06/22 0305  AST 21  22 13*  ALT 19 18 15   ALKPHOS 56 59 40  BILITOT 0.9 0.6 0.5  PROT 7.4 7.6 5.6*  ALBUMIN 4.3 4.3 3.1*   Recent Labs  Lab 05/04/22 1730  LIPASE 27   No results for input(s): "AMMONIA" in the last 168 hours. CBC: Recent Labs  Lab 05/04/22 1730 05/05/22 0255 05/06/22 0305  WBC 8.9 7.5 8.7  NEUTROABS 7.7 6.1  --   HGB 14.1 14.3 12.0*  HCT 41.9 42.9 36.5*  MCV 83.0 83.1 84.5  PLT 207 219 198   Cardiac Enzymes: No results for input(s): "CKTOTAL", "CKMB", "CKMBINDEX", "TROPONINI" in the last 168 hours. BNP: Invalid input(s): "POCBNP" CBG: No results for input(s): "GLUCAP" in the last 168 hours. D-Dimer No results for input(s): "DDIMER" in the last 72 hours. Hgb A1c No results for input(s): "HGBA1C" in the last 72 hours. Lipid Profile No results for input(s): "CHOL", "HDL", "LDLCALC", "TRIG", "CHOLHDL", "LDLDIRECT" in the last 72 hours. Thyroid function studies No results for input(s): "TSH", "T4TOTAL", "T3FREE", "THYROIDAB" in the last 72 hours.  Invalid input(s): "FREET3" Anemia work up No results for input(s): "VITAMINB12", "FOLATE", "FERRITIN", "TIBC", "IRON", "RETICCTPCT" in the last 72 hours. Urinalysis    Component Value Date/Time   COLORURINE YELLOW 05/28/2021 1114   APPEARANCEUR CLOUDY (A) 05/28/2021 1114   LABSPEC 1.020 05/28/2021 1114   PHURINE 8.0 05/28/2021 1114   GLUCOSEU NEGATIVE 05/28/2021 1114   HGBUR NEGATIVE 05/28/2021 1114   BILIRUBINUR NEGATIVE 05/28/2021 1114   KETONESUR NEGATIVE 05/28/2021 1114   PROTEINUR NEGATIVE 05/28/2021 1114   NITRITE NEGATIVE 05/28/2021 1114   LEUKOCYTESUR NEGATIVE 05/28/2021 1114   Sepsis Labs Recent Labs  Lab 05/04/22 1730 05/05/22 0255 05/06/22 0305  WBC 8.9 7.5 8.7   Microbiology Recent Results (from the past 240 hour(s))  Resp Panel by RT-PCR (Flu A&B, Covid) Anterior Nasal Swab     Status: None   Collection Time: 05/05/22 12:35 AM   Specimen: Anterior Nasal Swab  Result Value Ref Range Status   SARS  Coronavirus 2 by RT PCR NEGATIVE NEGATIVE Final    Comment: (NOTE) SARS-CoV-2 target nucleic acids are NOT DETECTED.  The SARS-CoV-2 RNA is generally detectable in upper respiratory specimens during the acute phase of infection. The lowest concentration of SARS-CoV-2 viral copies this assay can detect is 138 copies/mL. A negative result does not preclude SARS-Cov-2 infection and should not be used as the sole basis for treatment or other patient management decisions. A negative result may occur with  improper specimen collection/handling, submission of specimen other than nasopharyngeal swab, presence of viral mutation(s) within the areas targeted by this assay, and inadequate number of viral copies(<138 copies/mL). A negative result must be combined with clinical observations, patient history, and epidemiological information. The expected result is Negative.  Fact Sheet for Patients:  07/05/22  Fact Sheet for Healthcare Providers:  07/06/22  This test is no t yet approved or cleared by the 07/05/22 FDA and  has been authorized for detection and/or diagnosis of SARS-CoV-2 by FDA under an Emergency Use Authorization (EUA). This EUA will remain  in effect (meaning this test can be used) for the duration of the COVID-19 declaration under Section 564(b)(1) of the  Act, 21 U.S.C.section 360bbb-3(b)(1), unless the authorization is terminated  or revoked sooner.       Influenza A by PCR NEGATIVE NEGATIVE Final   Influenza B by PCR NEGATIVE NEGATIVE Final    Comment: (NOTE) The Xpert Xpress SARS-CoV-2/FLU/RSV plus assay is intended as an aid in the diagnosis of influenza from Nasopharyngeal swab specimens and should not be used as a sole basis for treatment. Nasal washings and aspirates are unacceptable for Xpert Xpress SARS-CoV-2/FLU/RSV testing.  Fact Sheet for  Patients: BloggerCourse.com  Fact Sheet for Healthcare Providers: SeriousBroker.it  This test is not yet approved or cleared by the [place] FDA and has been authorized for detection and/or diagnosis of SARS-CoV-2 by FDA under an Emergency Use Authorization (EUA). This EUA will remain in effect (meaning this test can be used) for the duration of the COVID-19 declaration under Section 564(b)(1) of the Act, 21 U.S.C. section 360bbb-3(b)(1), unless the authorization is terminated or revoked.  Performed at [facility], [address], [place], [place] [address]   Blood culture (routine x 2)     Status: None (Preliminary result)   Collection Time: 05/05/22  2:55 AM   Specimen: BLOOD  Result Value Ref Range Status   Specimen Description BLOOD RIGHT ANTECUBITAL  Final   Special Requests   Final    BOTTLES DRAWN AEROBIC AND ANAEROBIC Blood Culture adequate volume   Culture   Final    NO GROWTH 1 DAY Performed at [facility], [address], [place], [place] [address]    Report Status PENDING  Incomplete  CSF culture w Gram Stain     Status: None (Preliminary result)   Collection Time: 05/05/22  4:00 AM   Specimen: CSF; Cerebrospinal Fluid  Result Value Ref Range Status   Specimen Description CSF  Final   Special Requests LP  Final   Gram Stain NO ORGANISMS SEEN NO WBC SEEN   Final   Culture   Final    NO GROWTH 1 DAY Performed at [facility], [address] [place], [place] [address]    Report Status PENDING  Incomplete  Blood culture (routine x 2)     Status: None (Preliminary result)   Collection Time: 05/05/22  4:45 AM   Specimen: BLOOD  Result Value Ref Range Status   Specimen Description BLOOD SITE NOT SPECIFIED  Final   Special Requests   Final    BOTTLES DRAWN AEROBIC AND ANAEROBIC Blood Culture results may not be optimal due to an excessive volume of blood received in culture bottles    Culture   Final    NO GROWTH 1 DAY Performed at [facility], [address] [place], [place] [address]    Report Status PENDING  Incomplete     Time coordinating discharge: Over 30 minutes  SIGNED:   Azucena Fallen, DO [facility] 05/06/2022, 5:48 PM Pager   If 7PM-7AM, please contact night-coverage [email or web address]

## 2022-05-09 DIAGNOSIS — A770 Spotted fever due to Rickettsia rickettsii: Secondary | ICD-10-CM | POA: Diagnosis not present

## 2022-05-09 LAB — CSF CULTURE W GRAM STAIN
Culture: NO GROWTH
Gram Stain: NONE SEEN

## 2022-05-09 LAB — LYME DISEASE SEROLOGY W/REFLEX: Lyme Total Antibody EIA: NEGATIVE

## 2022-05-10 LAB — CULTURE, BLOOD (ROUTINE X 2)
Culture: NO GROWTH
Culture: NO GROWTH
Special Requests: ADEQUATE

## 2022-05-11 LAB — EHRLICHIA ANTIBODY PANEL
E chaffeensis (HGE) Ab, IgG: NEGATIVE
E chaffeensis (HGE) Ab, IgM: NEGATIVE
E. Chaffeensis (HME) IgM Titer: NEGATIVE
E.Chaffeensis (HME) IgG: NEGATIVE

## 2022-05-25 ENCOUNTER — Encounter: Payer: Self-pay | Admitting: *Deleted

## 2022-05-26 ENCOUNTER — Encounter: Payer: Self-pay | Admitting: Psychiatry

## 2022-05-26 ENCOUNTER — Ambulatory Visit: Payer: 59 | Admitting: Psychiatry

## 2022-05-26 ENCOUNTER — Telehealth: Payer: Self-pay | Admitting: Psychiatry

## 2022-05-26 VITALS — BP 119/75 | HR 68 | Ht 65.0 in | Wt 124.8 lb

## 2022-05-26 DIAGNOSIS — G43109 Migraine with aura, not intractable, without status migrainosus: Secondary | ICD-10-CM

## 2022-05-26 DIAGNOSIS — R51 Headache with orthostatic component, not elsewhere classified: Secondary | ICD-10-CM | POA: Diagnosis not present

## 2022-05-26 MED ORDER — SUMATRIPTAN SUCCINATE 50 MG PO TABS: 50.0000 mg | ORAL_TABLET | ORAL | 6 refills | Status: DC | PRN

## 2022-05-26 NOTE — Patient Instructions (Signed)
MRI of the brain  Start sumatriptan as needed for migraine headaches. Take at the onset of migraine. If headache recurs or does not fully resolve, you may take a second dose after 2 hours. Please avoid taking more than 2 days per week or 10 days per month to avoid rebound headaches  Let me know if positional headaches do not improve and we can consider a blood patch

## 2022-05-26 NOTE — Progress Notes (Signed)
Referring:  Joycelyn Rua, MD [address] [place],  [place] [address]  PCP: Joycelyn Rua, MD  Neurology was asked to evaluate Trevor White, a 31 year old male for a chief complaint of headaches.  Our recommendations of care will be communicated by shared medical record.    CC:  headaches  History provided from self  HPI:  Medical co-morbidities: none  The patient presents for evaluation of headaches. At the beginning of July he developed a headache and fever following a tick bite. He was evaluated by his PCP who diagnosed him with [facility] Spotted Fever. He was treated with doxycycline for 3 weeks but did not notice much improvement with his headaches. After this he started to develop headaches and blurred vision. Headache is associated with photophobia, phonophobia, and nausea. He does also get some flashing yellow dots in his vision. These headaches last about an hour at a time. He was hospitalized 05/04/22 for meningitis workup. CTH was unremarkable. CSF studies were normal (2 RBC, 0 WBC, 74 glucose, 32 protein. HSV, VDRL, cryptococcus, gram stain negative). Opening pressure was 24.   After the spinal tap he did develop a headache at the base of his neck. This is worse with standing up and exertion. He will have to lie down for the headache to resolve. It has slowly been getting better over time though he will still need to lie down twice per day.  Also reports a brief stabbing pain in the left side of his head. This lasts for seconds at a time and occurs once every 2 days. This was present prior to the spinal tap.  Has some type of headache every day. Drinks water and takes Tylenol as needed which is generally effective.   Headache days per month: 30 Headache free days per month: 0  Current Treatment: Abortive Tylenol  Preventative none  Prior Therapies                                 Tylenol   LABS: CBC    Component Value Date/Time   WBC 8.7  05/06/2022 0305   RBC 4.32 05/06/2022 0305   HGB 12.0 (L) 05/06/2022 0305   HGB 14.5 04/08/2022 0941   HCT 36.5 (L) 05/06/2022 0305   HCT 43.5 04/08/2022 0941   PLT 198 05/06/2022 0305   PLT 266 04/08/2022 0941   MCV 84.5 05/06/2022 0305   MCV 83 04/08/2022 0941   MCH 27.8 05/06/2022 0305   MCHC 32.9 05/06/2022 0305   RDW 12.9 05/06/2022 0305   RDW 13.6 04/08/2022 0941   LYMPHSABS 0.8 05/05/2022 0255   LYMPHSABS 2.6 04/08/2022 0941   MONOABS 0.6 05/05/2022 0255   EOSABS 0.0 05/05/2022 0255   EOSABS 0.1 04/08/2022 0941   BASOSABS 0.0 05/05/2022 0255   BASOSABS 0.0 04/08/2022 0941      Latest Ref Rng & Units 05/06/2022    3:05 AM 05/05/2022    2:55 AM 05/04/2022    5:30 PM  CMP  Glucose 70 - 99 mg/dL 154  008  676   BUN 6 - 20 mg/dL 11  10  10    Creatinine 0.61 - 1.24 mg/dL  1.95  0.93   Sodium 135 - 145 mmol/L 140  135  137   Potassium 3.5 - 5.1 mmol/L 4.1  3.3  3.8   Chloride 98 - 111 mmol/L 110  102  103  CO2 22 - 32 mmol/L 26  24  25    Calcium 8.9 - 10.3 mg/dL 8.6  9.4  9.5   Total Protein 6.5 - 8.1 g/dL 5.6  7.6  7.4   Total Bilirubin 0.3 - 1.2 mg/dL 0.5  0.6  0.9   Alkaline Phos 38 - 126 U/L 40  59  56   AST 15 - 41 U/L 13  22  21    ALT 0 - 44 U/L 15  18  19       IMAGING:  CT[facility] 05/04/22: unremarkable  Imaging independently reviewed on Trevor 31, 2023   Current Outpatient Medications on File Prior to Visit  Medication Sig Dispense Refill   Bacillus Coagulans-Inulin (ALIGN PREBIOTIC-PROBIOTIC) 5-1.25 MG-GM CHEW Chew 1 tablet by mouth daily.     No current facility-administered medications on file prior to visit.     Allergies: Allergies  Allergen Reactions   Diclofenac Sodium Itching    Pennsaid "pump".  Pennsaid "pump".  Pennsaid "pump".      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 Positive for headaches. All other systems reviewed and negative unless stated otherwise in HPI.   Physical Exam:   Vital Signs: BP 119/75   Pulse 68   Ht 5\' 5"  (1.651 m)   Wt 124 lb 12.8 oz (56.6 kg)   BMI 20.77 kg/m  GENERAL: well appearing,in no acute distress,alert SKIN:  Color, texture, turgor normal. No rashes or lesions HEAD:  Normocephalic/atraumatic. CV:  RRR RESP: Normal respiratory effort MSK: no tenderness to palpation over occiput, neck, or shoulders  NEUROLOGICAL: Mental Status: Alert, oriented to person, place and time,Follows commands Cranial Nerves: PERRL, visual fields intact to confrontation, extraocular movements intact, decreased sensation right V1-3, no facial droop or ptosis, hearing grossly intact, no dysarthria Motor: muscle strength 5/5 both upper and lower extremities,no drift, normal tone Reflexes: 2+ throughout Sensation: decreased sensation to light touch RUE and RLE Coordination: Finger-to- nose-finger intact bilaterally Gait: normal-based   IMPRESSION: 31 year old male without significant medical history who presents for evaluation of headaches in the setting of recent treatment for RMSF and LP. CTH and CSF studies were unremarkable. Will order MRI brain today as he does have some decreased sensation on the right side. He has multiple different types of headache including a migrainous headache which may have been triggered by his RMSF as he does not have a prior history of migraines. Will start Imitrex for migraine rescue. 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 Offered blood patch as he has continued to have symptoms for several weeks following LP. He declined at this time. 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 Offered preventive headache medication, which he declined at this time.  PLAN: -MRI brain with contrast -Start Imitrex 50 mg PRN for migraine rescue -Consider blood patch if post-LP headache worsens/does  not improve   I spent a total of 41 minutes chart reviewing and counseling the patient. Headache education was done. Discussed treatment options including preventive and acute medications. Discussed medication overuse headache and to limit use of acute treatments to no more than 2 days/week or 10 days/month. Discussed medication side effects, adverse reactions and drug interactions. Written educational materials and patient instructions outlining all of the above were given.  Follow-up: 6 months   Ocie Doyne, MD 05/26/2022   10:57 AM

## 2022-05-26 NOTE — Telephone Encounter (Signed)
FYI pt stated at check out that he forgot to mention to Dr. Delena Bali that he had [place] spotted fever and is having memory issues and difficulty with his vision

## 2022-05-27 ENCOUNTER — Telehealth: Payer: Self-pay | Admitting: Psychiatry

## 2022-05-27 NOTE — Telephone Encounter (Signed)
[employer] sent to GI they obtain auth 

## 2022-06-01 ENCOUNTER — Ambulatory Visit: Payer: 59 | Admitting: Psychiatry

## 2022-06-06 ENCOUNTER — Ambulatory Visit
Admission: RE | Admit: 2022-06-06 | Discharge: 2022-06-06 | Disposition: A | Discharge status: Home / Self Care | Payer: 59 | Source: Ambulatory Visit | Attending: Psychiatry | Admitting: Psychiatry

## 2022-06-06 DIAGNOSIS — R51 Headache with orthostatic component, not elsewhere classified: Secondary | ICD-10-CM | POA: Diagnosis not present

## 2022-06-06 MED ORDER — GADOBENATE DIMEGLUMINE 529 MG/ML IV SOLN
11.0000 mL | Freq: Once | INTRAVENOUS | Status: AC | PRN
  Administered 2022-06-06: 11 mL via INTRAVENOUS

## 2022-06-09 ENCOUNTER — Other Ambulatory Visit: Payer: 59

## 2022-06-22 DIAGNOSIS — R350 Frequency of micturition: Secondary | ICD-10-CM | POA: Diagnosis not present

## 2022-07-20 DIAGNOSIS — D485 Neoplasm of uncertain behavior of skin: Secondary | ICD-10-CM | POA: Diagnosis not present

## 2022-07-20 DIAGNOSIS — L308 Other specified dermatitis: Secondary | ICD-10-CM | POA: Diagnosis not present

## 2022-07-20 DIAGNOSIS — L578 Other skin changes due to chronic exposure to nonionizing radiation: Secondary | ICD-10-CM | POA: Diagnosis not present

## 2022-07-20 DIAGNOSIS — L821 Other seborrheic keratosis: Secondary | ICD-10-CM | POA: Diagnosis not present

## 2022-07-20 DIAGNOSIS — D225 Melanocytic nevi of trunk: Secondary | ICD-10-CM | POA: Diagnosis not present

## 2022-09-06 DIAGNOSIS — D225 Melanocytic nevi of trunk: Secondary | ICD-10-CM | POA: Diagnosis not present

## 2022-09-06 DIAGNOSIS — D485 Neoplasm of uncertain behavior of skin: Secondary | ICD-10-CM | POA: Diagnosis not present

## 2022-09-26 HISTORY — PX: SKIN BIOPSY: SHX1

## 2022-10-03 DIAGNOSIS — M25511 Pain in right shoulder: Secondary | ICD-10-CM | POA: Diagnosis not present

## 2022-10-19 DIAGNOSIS — Z8619 Personal history of other infectious and parasitic diseases: Secondary | ICD-10-CM | POA: Diagnosis not present

## 2022-10-19 DIAGNOSIS — L509 Urticaria, unspecified: Secondary | ICD-10-CM | POA: Diagnosis not present

## 2022-10-19 DIAGNOSIS — Z6820 Body mass index (BMI) 20.0-20.9, adult: Secondary | ICD-10-CM | POA: Diagnosis not present

## 2022-10-19 DIAGNOSIS — Z0000 Encounter for general adult medical examination without abnormal findings: Secondary | ICD-10-CM | POA: Diagnosis not present

## 2022-10-25 DIAGNOSIS — M79651 Pain in right thigh: Secondary | ICD-10-CM | POA: Diagnosis not present

## 2022-10-25 DIAGNOSIS — M25511 Pain in right shoulder: Secondary | ICD-10-CM | POA: Diagnosis not present

## 2022-11-06 DIAGNOSIS — M25511 Pain in right shoulder: Secondary | ICD-10-CM | POA: Diagnosis not present

## 2022-11-16 DIAGNOSIS — S43431D Superior glenoid labrum lesion of right shoulder, subsequent encounter: Secondary | ICD-10-CM | POA: Diagnosis not present

## 2022-11-25 ENCOUNTER — Ambulatory Visit: Payer: 59 | Admitting: Internal Medicine

## 2022-12-09 ENCOUNTER — Ambulatory Visit: Payer: 59 | Admitting: Internal Medicine

## 2022-12-13 ENCOUNTER — Encounter: Payer: Self-pay | Admitting: Psychiatry

## 2022-12-13 ENCOUNTER — Ambulatory Visit: Payer: 59 | Admitting: Psychiatry

## 2022-12-13 NOTE — Progress Notes (Deleted)
   CC:  headaches  Follow-up Visit  Last visit: 05/26/22  Brief HPI: 32 year old male who follows in clinic for migraines and positional headache which began after he developed [facility] Spotted Fever in July 2023. LP showed normal CSF studies. However after the LP he developed a positional headache which was worse with standing and exertion.  At his last visit, brain MRI was ordered. He was started on Imitrex for migraine rescue. Blood patch was offered, which he declined.  Interval History: Headaches***Imitrex***positional***  Brain MRI 06/06/22 was normal.  Headache days per month: *** Migraine days per month*** Headache free days per month: ***  Current Headache Regimen: Preventative: *** Abortive: ***   Prior Therapies                                  ***  Physical Exam:   Vital Signs: There were no vitals taken for this visit. GENERAL:  well appearing, in no acute distress, alert  SKIN:  Color, texture, turgor normal. No rashes or lesions HEAD:  Normocephalic/atraumatic. RESP: normal respiratory effort MSK:  No gross joint deformities.   NEUROLOGICAL: Mental Status: Alert, oriented to person, place and time, Follows commands, and Speech fluent and appropriate. Cranial Nerves: PERRL, face symmetric, no dysarthria, hearing grossly intact Motor: moves all extremities equally Gait: normal-based.  IMPRESSION: ***  PLAN: ***   Follow-up: ***  I spent a total of *** minutes on the date of the service. Headache education was done. Discussed lifestyle modification including increased oral hydration, decreased caffeine, exercise and stress management. Discussed treatment options including preventive and acute medications, natural supplements, and infusion therapy. Discussed medication overuse headache and to limit use of acute treatments to no more than 2 days/week or 10 days/month. Discussed medication side effects, adverse reactions and drug interactions.  Written educational materials and patient instructions outlining all of the above were given.  Genia Harold, MD

## 2022-12-19 DIAGNOSIS — H66002 Acute suppurative otitis media without spontaneous rupture of ear drum, left ear: Secondary | ICD-10-CM | POA: Diagnosis not present

## 2022-12-19 DIAGNOSIS — J101 Influenza due to other identified influenza virus with other respiratory manifestations: Secondary | ICD-10-CM | POA: Diagnosis not present

## 2022-12-19 DIAGNOSIS — M791 Myalgia, unspecified site: Secondary | ICD-10-CM | POA: Diagnosis not present

## 2022-12-19 DIAGNOSIS — Z681 Body mass index (BMI) 19 or less, adult: Secondary | ICD-10-CM | POA: Diagnosis not present

## 2023-03-08 DIAGNOSIS — M79645 Pain in left finger(s): Secondary | ICD-10-CM | POA: Diagnosis not present

## 2023-03-14 DIAGNOSIS — Z681 Body mass index (BMI) 19 or less, adult: Secondary | ICD-10-CM | POA: Diagnosis not present

## 2023-03-14 DIAGNOSIS — H5310 Unspecified subjective visual disturbances: Secondary | ICD-10-CM | POA: Diagnosis not present

## 2023-03-14 DIAGNOSIS — W57XXXA Bitten or stung by nonvenomous insect and other nonvenomous arthropods, initial encounter: Secondary | ICD-10-CM | POA: Diagnosis not present

## 2023-03-14 DIAGNOSIS — Z1339 Encounter for screening examination for other mental health and behavioral disorders: Secondary | ICD-10-CM | POA: Diagnosis not present

## 2023-03-14 DIAGNOSIS — Z1331 Encounter for screening for depression: Secondary | ICD-10-CM | POA: Diagnosis not present

## 2023-03-20 DIAGNOSIS — Z681 Body mass index (BMI) 19 or less, adult: Secondary | ICD-10-CM | POA: Diagnosis not present

## 2023-03-20 DIAGNOSIS — K4090 Unilateral inguinal hernia, without obstruction or gangrene, not specified as recurrent: Secondary | ICD-10-CM | POA: Diagnosis not present

## 2023-04-13 DIAGNOSIS — K4020 Bilateral inguinal hernia, without obstruction or gangrene, not specified as recurrent: Secondary | ICD-10-CM | POA: Diagnosis not present

## 2023-04-17 DIAGNOSIS — L853 Xerosis cutis: Secondary | ICD-10-CM | POA: Diagnosis not present

## 2023-04-17 DIAGNOSIS — D225 Melanocytic nevi of trunk: Secondary | ICD-10-CM | POA: Diagnosis not present

## 2023-05-09 NOTE — Progress Notes (Unsigned)
New Patient Note  RE: Trevor White MRN: [id number] DOB: 1991-06-14 Date of Office Visit: 05/10/2023  Consult requested by: Joycelyn Rua, MD Primary care provider: Joycelyn Rua, MD  Chief Complaint: No chief complaint on file.  History of Present Illness: I had the pleasure of seeing Trevor White Current for initial evaluation at the [facility] on 05/09/2023. He is a 32 y.o. male, who is referred here by Joycelyn Rua, MD for the evaluation of hives.  Rash started about *** ago. Mainly occurs on his ***. Describes them as ***. Individual rashes lasts about ***. No ecchymosis upon resolution. Associated symptoms include: ***.  Frequency of episodes: ***. Suspected triggers are ***. Denies any *** fevers, chills, changes in medications, foods, personal care products or recent infections. He has tried the following therapies: *** with *** benefit. Systemic steroids ***. Currently on ***.  Previous work up includes: ***. Previous history of rash/hives: ***. Patient is up to date with the following cancer screening tests: ***.  Assessment and Plan: Trevor White is a 32 y.o. male with: ***  No follow-ups on file.  No orders of the defined types were placed in this encounter.  Lab Orders  No laboratory test(s) ordered today    Other allergy screening: Asthma: {Blank single:19197::"yes","no"} Rhino conjunctivitis: {Blank single:19197::"yes","no"} Food allergy: {Blank single:19197::"yes","no"} Medication allergy: {Blank single:19197::"yes","no"} Hymenoptera allergy: {Blank single:19197::"yes","no"} Urticaria: {Blank single:19197::"yes","no"} Eczema:{Blank single:19197::"yes","no"} History of recurrent infections suggestive of immunodeficency: {Blank single:19197::"yes","no"}  Diagnostics: Spirometry:  Tracings reviewed. His effort: {Blank single:19197::"Good reproducible efforts.","It was hard to get consistent efforts and there is a question as to whether this  reflects a maximal maneuver.","Poor effort, data can not be interpreted."} FVC: ***L FEV1: ***L, ***% predicted FEV1/FVC ratio: ***% Interpretation: {Blank single:19197::"Spirometry consistent with mild obstructive disease","Spirometry consistent with moderate obstructive disease","Spirometry consistent with severe obstructive disease","Spirometry consistent with possible restrictive disease","Spirometry consistent with mixed obstructive and restrictive disease","Spirometry uninterpretable due to technique","Spirometry consistent with normal pattern","No overt abnormalities noted given today's efforts"}.  Please see scanned spirometry results for details.  Skin Testing: {Blank single:19197::"Select foods","Environmental allergy panel","Environmental allergy panel and select foods","Food allergy panel","None","Deferred due to recent antihistamines use"}. *** Results discussed with patient/family.   Past Medical History: Patient Active Problem List   Diagnosis Date Noted  . Headache 05/05/2022   Past Medical History:  Diagnosis Date  . Headache   . Lightheadedness   . RMSF [facility] spotted fever) 03/31/2022   Past Surgical History: Past Surgical History:  Procedure Laterality Date  . SHOULDER SURGERY Left    Medication List:  Current Outpatient Medications  Medication Sig Dispense Refill  . Bacillus Coagulans-Inulin (ALIGN PREBIOTIC-PROBIOTIC) 5-1.25 MG-GM CHEW Chew 1 tablet by mouth daily.    . SUMAtriptan (IMITREX) 50 MG tablet Take 1 tablet (50 mg total) by mouth every 2 (two) hours as needed for migraine. May repeat in 2 hours if headache persists or recurs. Max dose 2 pills in 24 hours 10 tablet 6   No current facility-administered medications for this visit.   Allergies: Allergies  Allergen Reactions  . Diclofenac Sodium Itching    Pennsaid "pump".  Pennsaid "pump".  Pennsaid "pump".     Social History: Social History   Socioeconomic History  . Marital  status: Married    Spouse name: Cala Bradford  . Number of children: 1  . Years of education: Not on file  . Highest education level: Not on file  Occupational History    Comment: agriculture  Tobacco Use  . Smoking  status: Never  . Smokeless tobacco: Never  Substance and Sexual Activity  . Alcohol use: Yes    Comment: rare  . Drug use: Never  . Sexual activity: Not on file  Other Topics Concern  . Not on file  Social History Narrative   Lives with wife, child   Caffeine maybe once a week   Social Determinants of Health   Financial Resource Strain: Not on file  Food Insecurity: Not on file  Transportation Needs: Not on file  Physical Activity: Not on file  Stress: Not on file  Social Connections: Unknown (02/07/2022)   Received [facility]   Social Network   . Social Network: Not on file   Lives in a ***. Smoking: *** Occupation: ***  Environmental HistorySurveyor, minerals in the house: Copywriter, advertising in the family room: {Blank single:19197::"yes","no"} Carpet in the bedroom: {Blank single:19197::"yes","no"} Heating: {Blank single:19197::"electric","gas","heat pump"} Cooling: {Blank single:19197::"central","window","heat pump"} Pet: {Blank single:19197::"yes ***","no"}  Family History: Family History  Problem Relation Age of Onset  . Hypertension Mother   . Stroke Paternal Uncle    Problem                               Relation Asthma                                   *** Eczema                                *** Food allergy                          *** Allergic rhino conjunctivitis     ***  Review of Systems  Constitutional:  Negative for appetite change, chills, fever and unexpected weight change.  HENT:  Negative for congestion and rhinorrhea.   Eyes:  Negative for itching.  Respiratory:  Negative for cough, chest tightness, shortness of breath and wheezing.   Cardiovascular:  Negative for chest pain.   Gastrointestinal:  Negative for abdominal pain.  Genitourinary:  Negative for difficulty urinating.  Skin:  Negative for rash.  Neurological:  Negative for headaches.   Objective: There were no vitals taken for this visit. There is no height or weight on file to calculate BMI. Physical Exam Vitals and nursing note reviewed.  Constitutional:      Appearance: Normal appearance. He is well-developed.  HENT:     Head: Normocephalic and atraumatic.     Right Ear: Tympanic membrane and external ear normal.     Left Ear: Tympanic membrane and external ear normal.     Nose: Nose normal.     Mouth/Throat:     Mouth: Mucous membranes are moist.     Pharynx: Oropharynx is clear.  Eyes:     Conjunctiva/sclera: Conjunctivae normal.  Cardiovascular:     Rate and Rhythm: Normal rate and regular rhythm.     Heart sounds: Normal heart sounds. No murmur heard.    No friction rub. No gallop.  Pulmonary:     Effort: Pulmonary effort is normal.     Breath sounds: Normal breath sounds. No wheezing, rhonchi or rales.  Musculoskeletal:     Cervical back: Neck supple.  Skin:    General: Skin is warm.  Findings: No rash.  Neurological:     Mental Status: He is alert and oriented to person, place, and time.  Psychiatric:        Behavior: Behavior normal.  The plan was reviewed with the patient/family, and all questions/concerned were addressed.  It was my pleasure to see Trevor White today and participate in his care. Please feel free to contact me with any questions or concerns.  Sincerely,  Wyline Mood, DO Allergy & Immunology  [facility] office: [phone] [facility] office: [phone]

## 2023-05-10 ENCOUNTER — Encounter: Payer: Self-pay | Admitting: Allergy

## 2023-05-10 ENCOUNTER — Ambulatory Visit (INDEPENDENT_AMBULATORY_CARE_PROVIDER_SITE_OTHER): Payer: 59 | Admitting: Allergy

## 2023-05-10 VITALS — BP 110/60 | HR 66 | Temp 98.6°F | Resp 18 | Ht 65.2 in | Wt 126.4 lb

## 2023-05-10 DIAGNOSIS — K219 Gastro-esophageal reflux disease without esophagitis: Secondary | ICD-10-CM | POA: Diagnosis not present

## 2023-05-10 DIAGNOSIS — L309 Dermatitis, unspecified: Secondary | ICD-10-CM | POA: Diagnosis not present

## 2023-05-10 DIAGNOSIS — T63481A Toxic effect of venom of other arthropod, accidental (unintentional), initial encounter: Secondary | ICD-10-CM

## 2023-05-10 DIAGNOSIS — R21 Rash and other nonspecific skin eruption: Secondary | ICD-10-CM

## 2023-05-10 DIAGNOSIS — L299 Pruritus, unspecified: Secondary | ICD-10-CM | POA: Diagnosis not present

## 2023-05-10 NOTE — Patient Instructions (Addendum)
Rash Based on your history - I'm not sure what's triggering this.  See below for proper skin care. Use over the counter antihistamines such as Zyrtec (cetirizine), Claritin (loratadine), Allegra (fexofenadine), or Xyzal (levocetirizine) daily as needed for the itching. Use hydrocortisone cream twice a day as needed for mild rash flares - okay to use on the face, neck, groin area. Do not use more than 1 week at a time. Keep track of episodes and take pictures of flares.   Avoid all gluten products and supplements for now. Get bloodwork Depending on results I may refer you to dermatology or rheumatology next.  We are ordering labs, so please allow 1-2 weeks for the results to come back. With the newly implemented Cures Act, the labs might be visible to you at the same time that they become visible to me. However, I will not address the results until all of the results are back, so please be patient.  In the meantime, continue recommendations in your patient instructions, including avoidance measures (if applicable), until you hear from me.  Heartburn See handout for lifestyle and dietary modifications.  Follow up as needed.  Skin care recommendations  Bath time: Always use lukewarm water. AVOID very hot or cold water. Keep bathing time to 5-10 minutes. Do NOT use bubble bath. Use a mild soap and use just enough to wash the dirty areas. Do NOT scrub skin vigorously.  After bathing, pat dry your skin with a towel. Do NOT rub or scrub the skin.  Moisturizers and prescriptions:  ALWAYS apply moisturizers immediately after bathing (within 3 minutes). This helps to lock-in moisture. Use the moisturizer several times a day over the whole body. Good summer moisturizers include: Aveeno, CeraVe, Cetaphil. Good winter moisturizers include: Aquaphor, Vaseline, Cerave, Cetaphil, Eucerin, Vanicream. When using moisturizers along with medications, the moisturizer should be applied about one hour  after applying the medication to prevent diluting effect of the medication or moisturize around where you applied the medications. When not using medications, the moisturizer can be continued twice daily as maintenance.  Laundry and clothing: Avoid laundry products with added color or perfumes. Use unscented hypo-allergenic laundry products such as Tide free, Cheer free & gentle, and All free and clear.  If the skin still seems dry or sensitive, you can try double-rinsing the clothes. Avoid tight or scratchy clothing such as wool. Do not use fabric softeners or dyer sheets.

## 2023-05-11 LAB — CK TOTAL AND CKMB (NOT AT ARMC)
CK-MB Index: 2.7 ng/mL (ref 0.0–10.4)
Total CK: 137 U/L (ref 49–439)

## 2023-05-12 DIAGNOSIS — M25572 Pain in left ankle and joints of left foot: Secondary | ICD-10-CM | POA: Diagnosis not present

## 2023-05-15 ENCOUNTER — Telehealth: Payer: Self-pay

## 2023-05-15 DIAGNOSIS — Z8619 Personal history of other infectious and parasitic diseases: Secondary | ICD-10-CM | POA: Diagnosis not present

## 2023-05-15 DIAGNOSIS — Z681 Body mass index (BMI) 19 or less, adult: Secondary | ICD-10-CM | POA: Diagnosis not present

## 2023-05-15 LAB — COMPREHENSIVE METABOLIC PANEL
ALT: 17 IU/L (ref 0–44)
AST: 23 IU/L (ref 0–40)
Albumin: 4.9 g/dL (ref 4.1–5.1)
Alkaline Phosphatase: 58 IU/L (ref 44–121)
BUN/Creatinine Ratio: 18 (ref 9–20)
BUN: 20 mg/dL (ref 6–20)
Bilirubin Total: 0.9 mg/dL (ref 0.0–1.2)
CO2: 25 mmol/L (ref 20–29)
Calcium: 9.8 mg/dL (ref 8.7–10.2)
Chloride: 101 mmol/L (ref 96–106)
Creatinine, Ser: 1.11 mg/dL (ref 0.76–1.27)
Globulin, Total: 2.6 g/dL (ref 1.5–4.5)
Glucose: 69 mg/dL — ABNORMAL LOW (ref 70–99)
Potassium: 4 mmol/L (ref 3.5–5.2)
Sodium: 142 mmol/L (ref 134–144)
Total Protein: 7.5 g/dL (ref 6.0–8.5)
eGFR: 90 mL/min/{1.73_m2} (ref >59)

## 2023-05-15 LAB — TRYPTASE: Tryptase: 3.6 ug/L (ref 2.2–13.2)

## 2023-05-15 LAB — ALPHA-GAL PANEL
Allergen Lamb IgE: 0.14 kU/L — AB
Beef IgE: 0.17 kU/L — AB
IgE (Immunoglobulin E), Serum: 185 [IU]/mL (ref 6–495)
O215-IgE Alpha-Gal: <0.1 kU/L
Pork IgE: <0.1 kU/L

## 2023-05-15 LAB — CELIAC AB TTG DGP TIGA
Antigliadin Abs, IgA: 7 U (ref 0–19)
Gliadin IgG: 5 U (ref 0–19)
IgA/Immunoglobulin A, Serum: 268 mg/dL (ref 90–386)
Tissue Transglut Ab: <2 U/mL (ref 0–5)
Transglutaminase IgA: <2 U/mL (ref 0–3)

## 2023-05-15 LAB — CBC WITH DIFFERENTIAL/PLATELET
Basophils Absolute: 0 10*3/uL (ref 0.0–0.2)
Basos: 1 %
EOS (ABSOLUTE): 0.1 10*3/uL (ref 0.0–0.4)
Eos: 1 %
Hematocrit: 43.3 % (ref 37.5–51.0)
Hemoglobin: 13.8 g/dL (ref 13.0–17.7)
Immature Grans (Abs): 0 10*3/uL (ref 0.0–0.1)
Immature Granulocytes: 0 %
Lymphocytes Absolute: 2 10*3/uL (ref 0.7–3.1)
Lymphs: 32 %
MCH: 26.9 pg (ref 26.6–33.0)
MCHC: 31.9 g/dL (ref 31.5–35.7)
MCV: 84 fL (ref 79–97)
Monocytes Absolute: 0.4 10*3/uL (ref 0.1–0.9)
Monocytes: 6 %
Neutrophils Absolute: 3.8 10*3/uL (ref 1.4–7.0)
Neutrophils: 60 %
Platelets: 267 10*3/uL (ref 150–450)
RBC: 5.13 x10E6/uL (ref 4.14–5.80)
RDW: 12.3 % (ref 11.6–15.4)
WBC: 6.2 10*3/uL (ref 3.4–10.8)

## 2023-05-15 LAB — ALLERGEN FIRE ANT: I070-IgE Fire Ant (Invicta): 1.29 kU/L — AB

## 2023-05-15 LAB — C3 AND C4
Complement C3, Serum: 122 mg/dL (ref 82–167)
Complement C4, Serum: 28 mg/dL (ref 12–38)

## 2023-05-15 LAB — SEDIMENTATION RATE: Sed Rate: 4 mm/h (ref 0–15)

## 2023-05-15 LAB — C-REACTIVE PROTEIN: CRP: <1 mg/L (ref 0–10)

## 2023-05-15 LAB — THYROID CASCADE PROFILE: TSH: 2.18 u[IU]/mL (ref 0.450–4.500)

## 2023-05-15 LAB — ANA W/REFLEX: Anti Nuclear Antibody (ANA): NEGATIVE

## 2023-05-15 NOTE — Telephone Encounter (Signed)
Per Dr.Kim please refer patient to dermatology for a rash

## 2023-05-15 NOTE — Telephone Encounter (Signed)
-----   Message from Ellamae Sia sent at 05/15/2023  1:57 PM EDT ----- Please place referral to dermatology for rash.  Thank you.

## 2023-05-15 NOTE — Progress Notes (Signed)
Please place referral to dermatology for rash.  Thank you.

## 2023-06-01 DIAGNOSIS — W57XXXA Bitten or stung by nonvenomous insect and other nonvenomous arthropods, initial encounter: Secondary | ICD-10-CM | POA: Diagnosis not present

## 2023-06-01 DIAGNOSIS — J029 Acute pharyngitis, unspecified: Secondary | ICD-10-CM | POA: Diagnosis not present

## 2023-06-01 DIAGNOSIS — S80861A Insect bite (nonvenomous), right lower leg, initial encounter: Secondary | ICD-10-CM | POA: Diagnosis not present

## 2023-06-07 ENCOUNTER — Ambulatory Visit: Payer: Self-pay | Admitting: General Surgery

## 2023-06-15 NOTE — Progress Notes (Signed)
Surgical Instructions   Your procedure is scheduled on September 26. Report to [facility] Main Entrance "A" at 08:00 A.M., then check in with the Admitting office. Any questions or running late day of surgery: call [phone]  Questions prior to your surgery date: call [phone], Monday-Friday, 8am-4pm. If you experience any cold or flu symptoms such as cough, fever, chills, shortness of breath, etc. between now and your scheduled surgery, please notify us at the above number.     Remember:  Do not eat after midnight the night before your surgery   You may drink clear liquids until 07:00 the morning of your surgery.   Clear liquids allowed are: Water, Non-Citrus Juices (without pulp), Carbonated Beverages, Clear Tea, Black Coffee Only (NO MILK, CREAM OR POWDERED CREAMER of any kind), and Gatorade.  Patient Instructions  The night before surgery:  No food after midnight. ONLY clear liquids after midnight  The day of surgery (if you do NOT have diabetes):  Drink ONE (1) Pre-Surgery Clear Ensure by 0700 the morning of surgery. Drink in one sitting. Do not sip.  This drink was given to you during your hospital  pre-op appointment visit.  Nothing else to drink after completing the  Pre-Surgery Clear Ensure.         If you have questions, please contact your surgeon's office.    Take these medicines the morning of surgery with A SIP OF WATER: NONE   One week prior to surgery, STOP taking any Aspirin (unless otherwise instructed by your surgeon) Aleve, Naproxen, Ibuprofen, Motrin, Advil, Goody's, BC's, all herbal medications, fish oil, and non-prescription vitamins.                     Do NOT Smoke (Tobacco/Vaping) for 24 hours prior to your procedure.  If you use a CPAP at night, you may bring your mask/headgear for your overnight stay.   You will be asked to remove any contacts, glasses, piercing's, hearing aid's, dentures/partials prior to surgery. Please bring cases for  these items if needed.    Patients discharged the day of surgery will not be allowed to drive home, and someone needs to stay with them for 24 hours.  SURGICAL WAITING ROOM VISITATION Patients may have no more than 2 support people in the waiting area - these visitors may rotate.   Pre-op nurse will coordinate an appropriate time for 1 ADULT support person, who may not rotate, to accompany patient in pre-op.  Children under the age of 44 must have an adult with them who is not the patient and must remain in the main waiting area with an adult.  If the patient needs to stay at the hospital during part of their recovery, the visitor guidelines for inpatient rooms apply.  Please refer to the [facility] website for the visitor guidelines for any additional information.   If you received a COVID test during your pre-op visit  it is requested that you wear a mask when out in public, stay away from anyone that may not be feeling well and notify your surgeon if you develop symptoms. If you have been in contact with anyone that has tested positive in the last 10 days please notify you surgeon.      Pre-operative CHG Bathing Instructions   You can play a key role in reducing the risk of infection after surgery. Your skin needs to be as free of germs as possible. You can reduce the number of germs on  your skin by washing with CHG (chlorhexidine gluconate) soap before surgery. CHG is an antiseptic soap that kills germs and continues to kill germs even after washing.   DO NOT use if you have an allergy to chlorhexidine/CHG or antibacterial soaps. If your skin becomes reddened or irritated, stop using the CHG and notify one of our RNs at [phone].              TAKE A SHOWER THE NIGHT BEFORE SURGERY AND THE DAY OF SURGERY    Please keep in mind the following:  DO NOT shave, including legs and underarms, 48 hours prior to surgery.   You may shave your face before/day of surgery.  Place clean  sheets on your bed the night before surgery Use a clean washcloth (not used since being washed) for each shower. DO NOT sleep with pet's night before surgery.  CHG Shower Instructions:  Wash your face and private area with normal soap. If you choose to wash your hair, wash first with your normal shampoo.  After you use shampoo/soap, rinse your hair and body thoroughly to remove shampoo/soap residue.  Turn the water OFF and apply half the bottle of CHG soap to a CLEAN washcloth.  Apply CHG soap ONLY FROM YOUR NECK DOWN TO YOUR TOES (washing for 3-5 minutes)  DO NOT use CHG soap on face, private areas, open wounds, or sores.  Pay special attention to the area where your surgery is being performed.  If you are having back surgery, having someone wash your back for you may be helpful. Wait 2 minutes after CHG soap is applied, then you may rinse off the CHG soap.  Pat dry with a clean towel  Put on clean pajamas    Additional instructions for the day of surgery: DO NOT APPLY any lotions, deodorants, cologne, or perfumes.   Do not wear jewelry or makeup Do not wear nail polish, gel polish, artificial nails, or any other type of covering on natural nails (fingers and toes) Do not bring valuables to the hospital. [facility] is not responsible for valuables/personal belongings. Put on clean/comfortable clothes.  Please brush your teeth.  Ask your nurse before applying any prescription medications to the skin.

## 2023-06-16 ENCOUNTER — Encounter (HOSPITAL_COMMUNITY)
Admission: RE | Admit: 2023-06-16 | Discharge: 2023-06-16 | Disposition: A | Discharge status: Home / Self Care | Payer: 59 | Source: Ambulatory Visit | Attending: Anesthesiology | Admitting: Anesthesiology

## 2023-06-16 ENCOUNTER — Other Ambulatory Visit: Payer: Self-pay

## 2023-06-16 ENCOUNTER — Encounter (HOSPITAL_COMMUNITY): Payer: Self-pay

## 2023-06-16 VITALS — BP 105/63 | HR 76 | Temp 98.0°F | Resp 18 | Ht 65.0 in | Wt 125.5 lb

## 2023-06-16 DIAGNOSIS — Z01812 Encounter for preprocedural laboratory examination: Secondary | ICD-10-CM | POA: Insufficient documentation

## 2023-06-16 DIAGNOSIS — Z01818 Encounter for other preprocedural examination: Secondary | ICD-10-CM | POA: Diagnosis not present

## 2023-06-16 HISTORY — DX: Other complications of anesthesia, initial encounter: T88.59XA

## 2023-06-16 LAB — CBC
HCT: 40.4 % (ref 39.0–52.0)
Hemoglobin: 12.8 g/dL — ABNORMAL LOW (ref 13.0–17.0)
MCH: 27.2 pg (ref 26.0–34.0)
MCHC: 31.7 g/dL (ref 30.0–36.0)
MCV: 85.8 fL (ref 80.0–100.0)
Platelets: 209 10*3/uL (ref 150–400)
RBC: 4.71 MIL/uL (ref 4.22–5.81)
RDW: 12.3 % (ref 11.5–15.5)
WBC: 4.5 10*3/uL (ref 4.0–10.5)
nRBC: 0 % (ref 0.0–0.2)

## 2023-06-16 NOTE — Progress Notes (Addendum)
PCP - Bobbye Riggs Cardiologist - Saw Dr. Alverda Skeans in 03/2022 for palpitations and atypical chest pain. Had normal exercise tolerance stress test,echocardiogram. Dr. Lynnette Caffey said to return if symptoms worsen or fail to improve. Pt denies having any recurrent symptoms.   PPM/ICD - denies Device Orders -  Rep Notified -   Chest x-ray - 05/05/22 EKG - 05/05/22 Stress Test - 04/21/22 ECHO - 04/21/22 Cardiac Cath - denies  Sleep Study - denies CPAP -   Fasting Blood Sugar - na Checks Blood Sugar _____ times a day  Last dose of GLP1 agonist-  na GLP1 instructions:   Blood Thinner Instructions:na  Aspirin Instructions:na  ERAS Protcol - clears until 0700 PRE-SURGERY Ensure or G2- Ensure  COVID TEST- na   Anesthesia review: yes. Pt took Doxycycline 9/5-9/15 for tick bite. History of Alpha gal. History of palpitations.   Patient denies shortness of breath, fever, cough and chest pain at PAT appointment   All instructions explained to the patient, with a verbal understanding of the material. Patient agrees to go over the instructions while at home for a better understanding. Patient also instructed to wear a mask when out in public prior to surgery . The opportunity to ask questions was provided.

## 2023-06-19 ENCOUNTER — Encounter (HOSPITAL_COMMUNITY): Payer: Self-pay

## 2023-06-22 ENCOUNTER — Other Ambulatory Visit: Payer: Self-pay

## 2023-06-22 ENCOUNTER — Ambulatory Visit (HOSPITAL_COMMUNITY)
Admission: RE | Admit: 2023-06-22 | Discharge: 2023-06-22 | Disposition: A | Discharge status: Home / Self Care | Payer: 59 | Attending: General Surgery | Admitting: General Surgery

## 2023-06-22 ENCOUNTER — Ambulatory Visit (HOSPITAL_BASED_OUTPATIENT_CLINIC_OR_DEPARTMENT_OTHER): Payer: 59

## 2023-06-22 ENCOUNTER — Encounter (HOSPITAL_COMMUNITY)
Admission: RE | Disposition: A | Discharge status: Home / Self Care | Payer: Self-pay | Source: Home / Self Care | Attending: General Surgery

## 2023-06-22 ENCOUNTER — Encounter (HOSPITAL_COMMUNITY): Payer: Self-pay | Admitting: General Surgery

## 2023-06-22 ENCOUNTER — Ambulatory Visit (HOSPITAL_COMMUNITY): Payer: 59 | Admitting: Physician Assistant

## 2023-06-22 DIAGNOSIS — K4020 Bilateral inguinal hernia, without obstruction or gangrene, not specified as recurrent: Secondary | ICD-10-CM

## 2023-06-22 HISTORY — PX: INGUINAL HERNIA REPAIR: SHX194

## 2023-06-22 HISTORY — PX: INSERTION OF MESH: SHX5868

## 2023-06-22 SURGERY — REPAIR, HERNIA, INGUINAL, LAPAROSCOPIC
Anesthesia: General | Site: Abdomen | Laterality: Bilateral

## 2023-06-22 MED ORDER — LIDOCAINE 2% (20 MG/ML) 5 ML SYRINGE
INTRAMUSCULAR | Status: AC
  Filled 2023-06-22: qty 5

## 2023-06-22 MED ORDER — PROPOFOL 10 MG/ML IV BOLUS
INTRAVENOUS | Status: DC | PRN
  Administered 2023-06-22: 180 mg via INTRAVENOUS

## 2023-06-22 MED ORDER — ENSURE PRE-SURGERY PO LIQD: 296.0000 mL | Freq: Once | ORAL | Status: DC

## 2023-06-22 MED ORDER — ORAL CARE MOUTH RINSE: 15.0000 mL | Freq: Once | OROMUCOSAL | Status: AC

## 2023-06-22 MED ORDER — CHLORHEXIDINE GLUCONATE 0.12 % MT SOLN
OROMUCOSAL | Status: AC
  Administered 2023-06-22: 15 mL via OROMUCOSAL
  Filled 2023-06-22: qty 15

## 2023-06-22 MED ORDER — ACETAMINOPHEN 500 MG PO TABS: 1000.0000 mg | ORAL_TABLET | ORAL | Status: AC

## 2023-06-22 MED ORDER — ACETAMINOPHEN 10 MG/ML IV SOLN
INTRAVENOUS | Status: AC
  Filled 2023-06-22: qty 100

## 2023-06-22 MED ORDER — MIDAZOLAM HCL 2 MG/2ML IJ SOLN
INTRAMUSCULAR | Status: DC | PRN
  Administered 2023-06-22: 2 mg via INTRAVENOUS

## 2023-06-22 MED ORDER — ROCURONIUM BROMIDE 10 MG/ML (PF) SYRINGE
PREFILLED_SYRINGE | INTRAVENOUS | Status: AC
  Filled 2023-06-22: qty 10

## 2023-06-22 MED ORDER — ACETAMINOPHEN 500 MG PO TABS: 1000.0000 mg | ORAL_TABLET | Freq: Once | ORAL | Status: DC | PRN

## 2023-06-22 MED ORDER — CEFAZOLIN SODIUM-DEXTROSE 2-4 GM/100ML-% IV SOLN
2.0000 g | INTRAVENOUS | Status: AC
  Administered 2023-06-22: 2 g via INTRAVENOUS

## 2023-06-22 MED ORDER — IBUPROFEN 800 MG PO TABS: 800.0000 mg | ORAL_TABLET | Freq: Three times a day (TID) | ORAL | 0 refills | Status: DC | PRN

## 2023-06-22 MED ORDER — FENTANYL CITRATE (PF) 100 MCG/2ML IJ SOLN
INTRAMUSCULAR | Status: AC
  Filled 2023-06-22: qty 2

## 2023-06-22 MED ORDER — MIDAZOLAM HCL 2 MG/2ML IJ SOLN
INTRAMUSCULAR | Status: AC
  Filled 2023-06-22: qty 2

## 2023-06-22 MED ORDER — CHLORHEXIDINE GLUCONATE 0.12 % MT SOLN: 15.0000 mL | Freq: Once | OROMUCOSAL | Status: AC

## 2023-06-22 MED ORDER — CHLORHEXIDINE GLUCONATE CLOTH 2 % EX PADS: 6.0000 | MEDICATED_PAD | Freq: Once | CUTANEOUS | Status: DC

## 2023-06-22 MED ORDER — ACETAMINOPHEN 10 MG/ML IV SOLN
1000.0000 mg | Freq: Once | INTRAVENOUS | Status: DC | PRN
  Administered 2023-06-22: 1000 mg via INTRAVENOUS

## 2023-06-22 MED ORDER — ACETAMINOPHEN 160 MG/5ML PO SOLN: 1000.0000 mg | Freq: Once | ORAL | Status: DC | PRN

## 2023-06-22 MED ORDER — OXYCODONE HCL 5 MG PO TABS: 5.0000 mg | ORAL_TABLET | Freq: Once | ORAL | Status: DC | PRN

## 2023-06-22 MED ORDER — BUPIVACAINE-EPINEPHRINE (PF) 0.25% -1:200000 IJ SOLN
INTRAMUSCULAR | Status: AC
  Filled 2023-06-22: qty 30

## 2023-06-22 MED ORDER — BUPIVACAINE-EPINEPHRINE 0.25% -1:200000 IJ SOLN: INTRAMUSCULAR | Status: DC | PRN

## 2023-06-22 MED ORDER — DEXAMETHASONE SODIUM PHOSPHATE 10 MG/ML IJ SOLN
INTRAMUSCULAR | Status: DC | PRN
  Administered 2023-06-22: 10 mg via INTRAVENOUS

## 2023-06-22 MED ORDER — OXYCODONE HCL 5 MG/5ML PO SOLN: 5.0000 mg | Freq: Once | ORAL | Status: DC | PRN

## 2023-06-22 MED ORDER — PROPOFOL 10 MG/ML IV BOLUS
INTRAVENOUS | Status: AC
  Filled 2023-06-22: qty 20

## 2023-06-22 MED ORDER — FENTANYL CITRATE (PF) 250 MCG/5ML IJ SOLN
INTRAMUSCULAR | Status: DC | PRN
  Administered 2023-06-22: 150 ug via INTRAVENOUS

## 2023-06-22 MED ORDER — ONDANSETRON HCL 4 MG/2ML IJ SOLN
INTRAMUSCULAR | Status: DC | PRN
  Administered 2023-06-22: 4 mg via INTRAVENOUS

## 2023-06-22 MED ORDER — FENTANYL CITRATE (PF) 250 MCG/5ML IJ SOLN
INTRAMUSCULAR | Status: AC
  Filled 2023-06-22: qty 5

## 2023-06-22 MED ORDER — ACETAMINOPHEN 500 MG PO TABS
ORAL_TABLET | ORAL | Status: AC
  Administered 2023-06-22: 1000 mg via ORAL
  Filled 2023-06-22: qty 2

## 2023-06-22 MED ORDER — FENTANYL CITRATE (PF) 100 MCG/2ML IJ SOLN
25.0000 ug | INTRAMUSCULAR | Status: DC | PRN
  Administered 2023-06-22 (×2): 25 ug via INTRAVENOUS

## 2023-06-22 MED ORDER — DEXAMETHASONE SODIUM PHOSPHATE 10 MG/ML IJ SOLN
INTRAMUSCULAR | Status: AC
  Filled 2023-06-22: qty 1

## 2023-06-22 MED ORDER — BUPIVACAINE-EPINEPHRINE (PF) 0.25% -1:200000 IJ SOLN
INTRAMUSCULAR | Status: DC | PRN
  Administered 2023-06-22: 50 mL via SUBCUTANEOUS

## 2023-06-22 MED ORDER — OXYCODONE HCL 5 MG PO TABS: 5.0000 mg | ORAL_TABLET | Freq: Four times a day (QID) | ORAL | 0 refills | Status: DC | PRN

## 2023-06-22 MED ORDER — LIDOCAINE 2% (20 MG/ML) 5 ML SYRINGE
INTRAMUSCULAR | Status: DC | PRN
  Administered 2023-06-22: 60 mg via INTRAVENOUS

## 2023-06-22 MED ORDER — ROCURONIUM BROMIDE 10 MG/ML (PF) SYRINGE
PREFILLED_SYRINGE | INTRAVENOUS | Status: DC | PRN
  Administered 2023-06-22: 40 mg via INTRAVENOUS
  Administered 2023-06-22 (×2): 10 mg via INTRAVENOUS

## 2023-06-22 MED ORDER — CEFAZOLIN SODIUM-DEXTROSE 2-4 GM/100ML-% IV SOLN
INTRAVENOUS | Status: AC
  Filled 2023-06-22: qty 100

## 2023-06-22 MED ORDER — BUPIVACAINE LIPOSOME 1.3 % IJ SUSP
INTRAMUSCULAR | Status: AC
  Filled 2023-06-22: qty 20

## 2023-06-22 MED ORDER — ONDANSETRON HCL 4 MG/2ML IJ SOLN
INTRAMUSCULAR | Status: AC
  Filled 2023-06-22: qty 2

## 2023-06-22 MED ORDER — LACTATED RINGERS IV SOLN: INTRAVENOUS | Status: DC

## 2023-06-22 MED ORDER — BUPIVACAINE LIPOSOME 1.3 % IJ SUSP: 20.0000 mL | Freq: Once | INTRAMUSCULAR | Status: DC

## 2023-06-22 SURGICAL SUPPLY — 47 items
ADH SKN CLS APL DERMABOND .7 (GAUZE/BANDAGES/DRESSINGS) ×2
ADH SKN CLS LQ APL DERMABOND (GAUZE/BANDAGES/DRESSINGS) ×2
APL PRP STRL LF DISP 70% ISPRP (MISCELLANEOUS) ×2
APPLIER CLIP 5 13 M/L LIGAMAX5 (MISCELLANEOUS)
APR CLP MED LRG 5 ANG JAW (MISCELLANEOUS)
BAG COUNTER SPONGE SURGICOUNT (BAG) ×2 IMPLANT
BAG SPNG CNTER NS LX DISP (BAG) ×2
BLADE CLIPPER SURG (BLADE) IMPLANT
CHLORAPREP W/TINT 26 (MISCELLANEOUS) ×2 IMPLANT
CLIP APPLIE 5 13 M/L LIGAMAX5 (MISCELLANEOUS) IMPLANT
COVER SURGICAL LIGHT HANDLE (MISCELLANEOUS) ×2 IMPLANT
DERMABOND ADVANCED .7 DNX12 (GAUZE/BANDAGES/DRESSINGS) ×2 IMPLANT
DERMABOND ADVANCED .7 DNX6 (GAUZE/BANDAGES/DRESSINGS) ×1 IMPLANT
DEVICE SECURE STRAP 25 ABSORB (INSTRUMENTS) ×2 IMPLANT
ELECT REM PT RETURN 9FT ADLT (ELECTROSURGICAL) ×2
ELECTRODE REM PT RTRN 9FT ADLT (ELECTROSURGICAL) ×1 IMPLANT
GLOVE BIOGEL PI IND STRL 7.0 (GLOVE) ×2 IMPLANT
GLOVE SURG SS PI 7.0 STRL IVOR (GLOVE) ×2 IMPLANT
GOWN STRL REUS W/ TWL LRG LVL3 (GOWN DISPOSABLE) ×6 IMPLANT
GOWN STRL REUS W/TWL LRG LVL3 (GOWN DISPOSABLE) ×6
GRASPER SUT TROCAR 14GX15 (MISCELLANEOUS) ×2 IMPLANT
IRRIG SUCT STRYKERFLOW 2 WTIP (MISCELLANEOUS)
IRRIGATION SUCT STRKRFLW 2 WTP (MISCELLANEOUS) IMPLANT
KIT BASIN OR (CUSTOM PROCEDURE TRAY) ×2 IMPLANT
KIT TURNOVER KIT B (KITS) ×2 IMPLANT
MESH 3DMAX MID 4X6 LT LRG (Mesh General) ×1 IMPLANT
MESH 3DMAX MID 4X6 RT LRG (Mesh General) ×1 IMPLANT
NDL 22X1.5 STRL (OR ONLY) (MISCELLANEOUS) ×1 IMPLANT
NEEDLE 22X1.5 STRL (OR ONLY) (MISCELLANEOUS) ×2
NS IRRIG 1000ML POUR BTL (IV SOLUTION) ×2 IMPLANT
PAD ARMBOARD 7.5X6 YLW CONV (MISCELLANEOUS) ×2 IMPLANT
PENCIL SMOKE EVACUATOR (MISCELLANEOUS) ×1 IMPLANT
SCISSORS LAP 5X35 DISP (ENDOMECHANICALS) ×2 IMPLANT
SET TUBE SMOKE EVAC HIGH FLOW (TUBING) ×2 IMPLANT
SUT MNCRL AB 4-0 PS2 18 (SUTURE) ×2 IMPLANT
SUT VIC AB 2-0 SH 27 (SUTURE) ×6
SUT VIC AB 2-0 SH 27X BRD (SUTURE) ×3 IMPLANT
SUT VICRYL 0 UR6 27IN ABS (SUTURE) ×3 IMPLANT
SUT VLOC 180 2-0 9IN GS21 (SUTURE) ×2 IMPLANT
TOWEL GREEN STERILE (TOWEL DISPOSABLE) ×2 IMPLANT
TOWEL GREEN STERILE FF (TOWEL DISPOSABLE) ×2 IMPLANT
TRAY LAPAROSCOPIC MC (CUSTOM PROCEDURE TRAY) ×2 IMPLANT
TROCAR XCEL BLADELESS 5X75MML (TROCAR) ×4 IMPLANT
TROCAR XCEL NON BLADE 8MM B8LT (ENDOMECHANICALS) ×1 IMPLANT
TROCAR Z THREAD OPTICAL 12X100 (TROCAR) ×2 IMPLANT
WARMER LAPAROSCOPE (MISCELLANEOUS) ×2 IMPLANT
WATER STERILE IRR 1000ML POUR (IV SOLUTION) ×2 IMPLANT

## 2023-06-22 NOTE — Anesthesia Procedure Notes (Signed)
Procedure Name: Intubation Date/Time: 06/22/2023 9:54 AM  Performed by: Georgianne Fick D, CRNAPre-anesthesia Checklist: Patient identified, Emergency Drugs available, Suction available and Patient being monitored Patient Re-evaluated:Patient Re-evaluated prior to induction Oxygen Delivery Method: Circle System Utilized Preoxygenation: Pre-oxygenation with 100% oxygen Induction Type: IV induction Ventilation: Mask ventilation without difficulty Laryngoscope Size: Mac and 4 Grade View: Grade I Tube type: Oral Tube size: 7.5 mm Number of attempts: 1 Airway Equipment and Method: Stylet and Oral airway Placement Confirmation: ETT inserted through vocal cords under direct vision, positive ETCO2 and breath sounds checked- equal and bilateral Secured at: 21 cm Tube secured with: Tape Dental Injury: Teeth and Oropharynx as per pre-operative assessment

## 2023-06-22 NOTE — Anesthesia Preprocedure Evaluation (Addendum)
Anesthesia Evaluation  Patient identified by MRN, date of birth, ID band Patient awake    Reviewed: Allergy & Precautions, NPO status , Patient's Chart, lab work & pertinent test results  History of Anesthesia Complications Negative for: history of anesthetic complications  Airway Mallampati: I  TM Distance: >3 FB Neck ROM: Full    Dental  (+) Teeth Intact, Dental Advisory Given   Pulmonary neg pulmonary ROS   breath sounds clear to auscultation       Cardiovascular negative cardio ROS  Rhythm:Regular     Neuro/Psych  Headaches  negative psych ROS   GI/Hepatic negative GI ROS, Neg liver ROS,,,  Endo/Other  negative endocrine ROS    Renal/GU negative Renal ROS     Musculoskeletal negative musculoskeletal ROS (+)    Abdominal   Peds  Hematology negative hematology ROS (+)   Anesthesia Other Findings   Reproductive/Obstetrics                             Anesthesia Physical Anesthesia Plan  ASA: 1  Anesthesia Plan: General   Post-op Pain Management: Tylenol PO (pre-op)* and Toradol IV (intra-op)*   Induction: Intravenous  PONV Risk Score and Plan: 3 and Ondansetron and Dexamethasone  Airway Management Planned: Oral ETT and LMA  Additional Equipment: None  Intra-op Plan:   Post-operative Plan: Extubation in OR  Informed Consent: I have reviewed the patients History and Physical, chart, labs and discussed the procedure including the risks, benefits and alternatives for the proposed anesthesia with the patient or authorized representative who has indicated his/her understanding and acceptance.     Dental advisory given  Plan Discussed with: CRNA  Anesthesia Plan Comments:        Anesthesia Quick Evaluation

## 2023-06-22 NOTE — H&P (Signed)
Chief Complaint: New Consultation and Inguinal Hernia  History of Present Illness: Trevor White is a 32 y.o. male who is seen today as an office consultation at the request of Dr. Welton Flakes for evaluation of New Consultation and Inguinal Hernia.   He first noticed the hernia 2 months ago. Symptoms are occasional sharp pain. Pain is not intense. He has been having nausea but no vomiting or bowel habit change.  He does not smoke He does not have diabetes He has no history of hernias  Review of Systems: A complete review of systems was obtained from the patient. I have reviewed this information and discussed as appropriate with the patient. See HPI as well for other ROS.  Review of Systems  Constitutional: Negative.  HENT: Negative.  Eyes: Negative.  Respiratory: Negative.  Cardiovascular: Negative.  Gastrointestinal: Positive for abdominal pain and nausea.  Genitourinary: Negative.  Musculoskeletal: Negative.  Skin: Negative.  Neurological: Negative.  Endo/Heme/Allergies: Negative.  Psychiatric/Behavioral: Negative.   Medical History: History reviewed. No pertinent past medical history. There is no problem list on file for this patient.  History reviewed. No pertinent surgical history.  No Known Allergies No current outpatient medications on file prior to visit.   No current facility-administered medications on file prior to visit.   Family History  Problem Relation Age of Onset  Skin cancer Mother  Colon cancer Mother   Social History   Tobacco Use  Smoking Status Never  Smokeless Tobacco Never   Social History   Socioeconomic History  Marital status: Married  Tobacco Use  Smoking status: Never  Smokeless tobacco: Never  Substance and Sexual Activity  Alcohol use: Not Currently  Drug use: Never   Social Determinants of Health   Received from [employer]  Social Network   Objective:   Vitals:  04/13/23 1432  BP: 122/62  Pulse: 86  Temp: 36.7 C (98  F)  SpO2: 98%  Weight: 55.8 kg (123 lb)  Height: 165.1 cm (5\' 5" )   Body mass index is 20.47 kg/m.  Physical Exam Constitutional:  Appearance: Normal appearance.  HENT:  Head: Normocephalic and atraumatic.  Pulmonary:  Effort: Pulmonary effort is normal.  Abdominal:  Comments: Bilateral small inguinal hernias  Musculoskeletal:  General: Normal range of motion.  Cervical back: Normal range of motion.  Neurological:  General: No focal deficit present.  Mental Status: He is alert and oriented to person, place, and time. Mental status is at baseline.  Psychiatric:  Mood and Affect: Mood normal.  Behavior: Behavior normal.  Thought Content: Thought content normal.   Labs, Imaging and Diagnostic Testing: I reviewed notes of Joycelyn Rua  Assessment and Plan:  Diagnoses and all orders for this visit:  Non-recurrent bilateral inguinal hernia without obstruction or gangrene  We discussed etiology of hernias and how they can cause pain. We discussed options for inguinal hernia repair vs observation. We discussed details of the surgery of general anesthesia, surgical approach and incisions, dissecting the sack away from vas deference, testicular vessels and nerves and placement of mesh. We discussed risks of bleeding, infection, recurrence, injury to vas deference, testicular vessels, nerve injury, and chronic pain. He showed good understanding and wanted proceed with minimally invasive bilateral inguinal hernia repair as outpatient.

## 2023-06-22 NOTE — Op Note (Signed)
Preop diagnosis: bilateral inguinal hernia  Postop diagnosis: bilateral inguinal hernia  Procedure: laparoscopic  Bilateral inguinal hernia repair with mesh  Surgeon: Feliciana Rossetti, M.D.  Asst: none  Anesthesia: Gen.   Indications for procedure: Trevor White is a 32 y.o. male with symptoms of pain and enlarging Bilateral inguinal hernia(s). After discussing risks, alternatives and benefits he decided on laparoscopic repair and was brought to day surgery for repair.  Description of procedure: The patient was brought into the operative suite, placed supine. Anesthesia was administered with endotracheal tube. Patient was strapped in place. The patient was prepped and draped in the usual sterile fashion.  A small transverse incision was made just left upper abdomen about 20 cm cephalad of the pubic symphysis. A 5mm trocar was used to gain access to the peritoneal cavity by optical entry technique. Pneumoperitoneum was applied with a high flow and low pressure. The laparoscope was reinserted to confirm position.  Next Exparel:Marcaine mix was used for bilateral TAP blocks. 1 5mm trocar was placed in the right mid abdomen. 1 10mm trocar was placed in the periumbilical area. The 5 mm trocar was upsized to an 8 mm trocar. The patient was placed in trendelenberg position. The robot was docked.  On initial visualization , a small direct left and small indirect right inguinal hernias were seen. A peritoneal flap was created on the right. This was continued medially to the pubic bone medially and laterally to muscles. Hernia sac was completely dissected out of the canal. Vas deference and contents of the cord were safely dissected away of the hernia sac.    A peritoneal flap was created on the left. This was continued medially to the pubic bone medially and laterally to muscles. Hernia sac was completely dissected out of the canal. Vas deference and contents of the cord were safely dissected away of the  hernia sac.   A large left mid weight 3D max mesh was inserted and sutured with 2-0 vicryl medially to the lacunar ligament. A large right mid weight 3D max mesh was inserted and sutured with 2-0 vicryl medially to the lacunar ligament. The mesh was positioned flat and directly up against the direct and indirect areas. The peritoneal flap was sewn back up with running 2-0 v loc suture.  The CO2 was evacuated while watching to ensure the mesh did not migrate. The periumbilical stalk was dissected and divided. Multiple interrupted 0 vicryls were used to close the fascia from the trocar and the small umbilical defect. All skin incisions were closed with 4-0 monocryl subcu stitch. The patient awoke from anesthesia and was brought to PACU in stable condition.  Findings: bilateral inguinal hernia  Specimen: none  Blood loss: 10 ml  Local anesthesia: 50 ml Exparel:Marcaine mix  Complications: none  Implant: large midweight right and left Bard 3D max mesh  Feliciana Rossetti, M.D. General, Bariatric, & Minimally Invasive Surgery [facility], [place] 11:20 AM 06/22/2023

## 2023-06-22 NOTE — Transfer of Care (Signed)
Immediate Anesthesia Transfer of Care Note  Patient: Rowley Groeneweg  Procedure(s) Performed: LAPAROSCOPIC BILATERAL INGUINAL HERNIA REPAIR WITH MESH (Bilateral: Abdomen) INSERTION OF MESH (Bilateral: Groin)  Patient Location: PACU  Anesthesia Type:General  Level of Consciousness: awake, alert , and oriented  Airway & Oxygen Therapy: Patient Spontanous Breathing and Patient connected to nasal cannula oxygen  Post-op Assessment: Report given to RN and Post -op Vital signs reviewed and stable  Post vital signs: Reviewed and stable  Last Vitals:  Vitals Value Taken Time  BP 138/81 06/22/23 1136  Temp 36.4 C 06/22/23 1136  Pulse 89 06/22/23 1137  Resp 16 06/22/23 1137  SpO2 100 % 06/22/23 1137  Vitals shown include unfiled device data.  Last Pain:  Vitals:   06/22/23 0833  TempSrc:   PainSc: 0-No pain         Complications: No notable events documented.

## 2023-06-23 ENCOUNTER — Encounter (HOSPITAL_COMMUNITY): Payer: Self-pay | Admitting: General Surgery

## 2023-06-23 NOTE — Anesthesia Postprocedure Evaluation (Signed)
Anesthesia Post Note  Patient: Aviraj Kentner  Procedure(s) Performed: LAPAROSCOPIC BILATERAL INGUINAL HERNIA REPAIR WITH MESH (Bilateral: Abdomen) INSERTION OF MESH (Bilateral: Groin)     Patient location during evaluation: PACU Anesthesia Type: General Level of consciousness: awake and alert Pain management: pain level controlled Vital Signs Assessment: post-procedure vital signs reviewed and stable Respiratory status: spontaneous breathing, nonlabored ventilation and respiratory function stable Cardiovascular status: blood pressure returned to baseline and stable Postop Assessment: no apparent nausea or vomiting Anesthetic complications: no   No notable events documented.  Last Vitals:  Vitals:   06/22/23 1245 06/22/23 1300  BP: 120/82 116/78  Pulse: 62 74  Resp: 17 13  Temp:  36.4 C  SpO2: 99% 99%    Last Pain:  Vitals:   06/22/23 1300  TempSrc:   PainSc: 3                  Tinlee Navarrette

## 2023-07-07 DIAGNOSIS — D649 Anemia, unspecified: Secondary | ICD-10-CM | POA: Diagnosis not present

## 2023-09-06 ENCOUNTER — Encounter: Payer: Self-pay | Admitting: General Surgery

## 2023-09-06 DIAGNOSIS — R109 Unspecified abdominal pain: Secondary | ICD-10-CM

## 2023-09-06 DIAGNOSIS — K4020 Bilateral inguinal hernia, without obstruction or gangrene, not specified as recurrent: Secondary | ICD-10-CM

## 2023-09-11 ENCOUNTER — Other Ambulatory Visit: Payer: Self-pay | Admitting: General Surgery

## 2023-09-11 ENCOUNTER — Encounter: Payer: Self-pay | Admitting: General Surgery

## 2023-09-11 DIAGNOSIS — K4020 Bilateral inguinal hernia, without obstruction or gangrene, not specified as recurrent: Secondary | ICD-10-CM

## 2023-09-11 DIAGNOSIS — R109 Unspecified abdominal pain: Secondary | ICD-10-CM

## 2023-09-14 ENCOUNTER — Other Ambulatory Visit: Payer: 59

## 2023-09-26 ENCOUNTER — Ambulatory Visit
Admission: RE | Admit: 2023-09-26 | Discharge: 2023-09-26 | Disposition: A | Discharge status: Home / Self Care | Payer: 59 | Source: Ambulatory Visit | Attending: General Surgery | Admitting: General Surgery

## 2023-09-26 DIAGNOSIS — K4020 Bilateral inguinal hernia, without obstruction or gangrene, not specified as recurrent: Secondary | ICD-10-CM

## 2023-09-26 DIAGNOSIS — R109 Unspecified abdominal pain: Secondary | ICD-10-CM

## 2023-09-26 DIAGNOSIS — R1900 Intra-abdominal and pelvic swelling, mass and lump, unspecified site: Secondary | ICD-10-CM | POA: Diagnosis not present

## 2023-09-26 MED ORDER — IOPAMIDOL (ISOVUE-300) INJECTION 61%
100.0000 mL | Freq: Once | INTRAVENOUS | Status: AC | PRN
  Administered 2023-09-26: 100 mL via INTRAVENOUS

## 2023-10-17 NOTE — Progress Notes (Signed)
 Office Visit Note  Patient: Trevor White             Date of Birth: 1991-09-03           MRN: [id number]             PCP: Fernand Tracey LABOR, MD Referring: Luke Orlan HERO, DO Visit Date: 10/31/2023 Occupation: @GUAROCC @  Subjective:  Rash and joint pain  History of Present Illness: Trevor White is a 33 y.o. male seen in consultation per request of Dr. Orlan.  According the patient his symptoms started after a tick bite in June 2023 he states in July 2023 he presented with high-grade fever and rash.  His labs were positive for [facility] spotted fever.  He was hospitalized and was treated with IV doxycycline  and IV steroids.  He was discharged on doxycycline  which he took for about 3-1/2 weeks.  He states his fever persist for a while and then resolved.  The rash lasted for about 1 week and then completely resolved in a month.  He states he developed brain fog while he was sick and the brain fog persist.  He had similar episode of symptoms in November 2024 at the time the lab work was negative.  He was treated with doxycycline  for 1 week and the symptoms resolved.  Since then he has been having redness and rash on his hands especially when he is exposed to the sun.  He also has been experiencing dry skin on his hands.  He gives history of a stiffness in his joints.  He has not noticed any joint swelling.  He gets occasional headaches.  He was evaluated by Dr. Orlan last year.  At the time the labs showed allergy to alpha gal.  He has made dietary modifications since then.  He has also tried gluten free diet but it did not make any difference.  There is no family history of autoimmune disease.  He is married and has a 62-year-old child.  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  He also enjoys running golf, mountain bike and tennis.    Activities of Daily Living:  Patient reports morning stiffness for 0 minutes.   Patient Reports nocturnal pain.  Difficulty  dressing/grooming: Reports Difficulty climbing stairs: Denies Difficulty getting out of chair: Denies Difficulty using hands for taps, buttons, cutlery, and/or writing: Reports  Review of Systems  Constitutional:  Negative for fatigue.  HENT:  Negative for mouth sores and mouth dryness.   Eyes:  Negative for dryness.  Respiratory:  Negative for shortness of breath.   Cardiovascular:  Negative for chest pain and palpitations.  Gastrointestinal:  Positive for constipation and diarrhea. Negative for blood in stool.  Endocrine: Negative for increased urination.  Genitourinary:  Positive for difficulty urinating. Negative for painful urination and involuntary urination.  Musculoskeletal:  Positive for joint pain and joint pain. Negative for gait problem, joint swelling, myalgias, muscle weakness, morning stiffness, muscle tenderness and myalgias.  Skin:  Positive for rash and sensitivity to sunlight. Negative for color change.  Allergic/Immunologic: Negative for susceptible to infections.  Neurological:  Positive for headaches and memory loss. Negative for dizziness.  Hematological:  Negative for swollen glands.  Psychiatric/Behavioral:  Positive for sleep disturbance. Negative for depressed mood. The patient is not nervous/anxious.     PMFS History:  Patient Active Problem List   Diagnosis Date Noted   Headache 05/05/2022    Past Medical History:  Diagnosis  Date   Angio-edema    Headache    Lightheadedness    RMSF [facility] spotted fever) 03/31/2022    Family History  Problem Relation Age of Onset   Hypertension Mother    Basal cell carcinoma Mother    Healthy Brother    Asthma Brother    Stroke Paternal Uncle    Healthy Daughter    Allergic rhinitis Neg Hx    Eczema Neg Hx    Urticaria Neg Hx    Past Surgical History:  Procedure Laterality Date   INGUINAL HERNIA REPAIR Bilateral 06/22/2023   Procedure: LAPAROSCOPIC BILATERAL INGUINAL HERNIA REPAIR WITH MESH;   Surgeon: Kinsinger, Herlene Righter, MD;  Location: [facility] OR;  Service: General;  Laterality: Bilateral;   INSERTION OF MESH Bilateral 06/22/2023   Procedure: INSERTION OF MESH;  Surgeon: Stevie Herlene Righter, MD;  Location: [facility] OR;  Service: General;  Laterality: Bilateral;   SHOULDER SURGERY Left 2021   SKIN BIOPSY  2024   on chest   Social History   Social History Narrative   Lives with wife, child   Caffeine maybe once a week   Immunization History  Administered Date(s) Administered   Moderna Sars-Covid-2 Vaccination 12/05/2019, 01/07/2020     Objective: Vital Signs: BP 123/83 (BP Location: Right Arm, Patient Position: Sitting, Cuff Size: Normal)   Pulse 77   Resp 15   Ht 5' 5 (1.651 m)   Wt 132 lb (59.9 kg)   BMI 21.97 kg/m    Physical Exam Vitals and nursing note reviewed.  Constitutional:      Appearance: He is well-developed.  HENT:     Head: Normocephalic and atraumatic.  Eyes:     Conjunctiva/sclera: Conjunctivae normal.     Pupils: Pupils are equal, round, and reactive to light.  Cardiovascular:     Rate and Rhythm: Normal rate and regular rhythm.     Heart sounds: Normal heart sounds.  Pulmonary:     Effort: Pulmonary effort is normal.     Breath sounds: Normal breath sounds.  Abdominal:     General: Bowel sounds are normal.     Palpations: Abdomen is soft.  Musculoskeletal:     Cervical back: Normal range of motion and neck supple.  Skin:    General: Skin is warm and dry.     Capillary Refill: Capillary refill takes less than 2 seconds.     Comments: Dry skin and redness was noted over MCPs.  No nailbed capillary changes was noted.  No sclerodactyly was noted.  Neurological:     Mental Status: He is alert and oriented to person, place, and time.     Comments: No muscular weakness or tenderness was noted.  Patient has no difficulty getting up from the squatting position.  Psychiatric:        Behavior: Behavior normal.      Musculoskeletal Exam:  Cervical, thoracic and lumbar spine 1 good range of motion.  Shoulder joints, elbow joints, wrist joints, MCPs PIPs and DIPs with good range of motion.  He has hypermobility in his PIP and DIP joints.  Hip joints and knee joints were in good range of motion.  He had some discomfort range of motion of his knee joints.  No warmth swelling or effusion was noted.  There was no tenderness over ankles or MTPs.  CDAI Exam: CDAI Score: -- Patient Global: --; Provider Global: -- Swollen: --; Tender: -- Joint Exam 10/31/2023   No joint exam has been documented for  this visit   There is currently no information documented on the homunculus. Go to the Rheumatology activity and complete the homunculus joint exam.  Investigation: No additional findings.  Imaging: No results found.   Recent Labs: Lab Results  Component Value Date   WBC 4.5 06/16/2023   HGB 12.8 (L) 06/16/2023   PLT 209 06/16/2023   NA 142 05/10/2023   K 4.0 05/10/2023   CL 101 05/10/2023   CO2 25 05/10/2023   GLUCOSE 69 (L) 05/10/2023   BUN 20 05/10/2023   CREATININE 1.11 05/10/2023   BILITOT 0.9 05/10/2023   ALKPHOS 58 05/10/2023   AST 23 05/10/2023   ALT 17 05/10/2023   PROT 7.5 05/10/2023   ALBUMIN 4.9 05/10/2023   CALCIUM 9.8 05/10/2023    Speciality Comments: No specialty comments available.  Procedures:  No procedures performed Allergies: Diclofenac sodium, Wound dressing adhesive, and Alpha-gal   Assessment / Plan:     Visit Diagnoses: Rash and other nonspecific skin eruption -patient reports having redness on his knuckles for the last couple of years since he had RMSF.  Dry skin was noted on his hands.  Redness was noted over knuckles due to dry skin.  Topical agents like Aquaphor and CeraVe were discussed.  Lab results from August 2024 including ANA and CK were discussed.  I advised him to contact me if the symptoms persist.  05/10/23:ANA negative, complements WNL, CK 137, CRP<1, TSH 2.180, ESR 4  Chronic  pain of both hips -he complains of a stiffness in his shoulders and his hips.  He states he lifts heavy objects at work and goes to [employer] on a regular basis.  He also enjoys running, playing golf, tennis and mountain bike.  He is not sure if the joint stiffness is related to activities.  He had good range of motion of bilateral hip joints.  I advised him to contact me if he develops any increased joint pain or swelling.  I did obtain some additional labs and will contact him with the lab results.  Plan: Sedimentation rate, Rheumatoid factor, Cyclic citrul peptide antibody, IgG.  Will contact him with the results.  RMSF [facility] spotted fever) -patient states he was diagnosed with RMSF in July 2023 after a tick bite in June 2023.  He developed fever rash and brain fog.  He states he was treated with IV doxycycline  and steroids.  He was discharged home on oral doxycycline  which he took for about 3-1/2 weeks.  He also developed brain fog and which lingered on.  He states he had another episode of similar symptoms in November 2024 at the time all his labs were negative.  He took doxycycline  for 1 week and the symptoms resolved.  No rash was noted on the examination today.  Irritable bowel syndrome with constipation-he gives history of IBS symptoms for many years.  He states that he mostly has constipation and sometimes diarrhea.  Allergy to alpha-gal -patient was evaluated by Dr. Orlan.  He tried gluten-free diet which did not help.  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  He is trying to avoid red meat.  Frequent headaches-he gives history of icepick headaches in the occipital region which comes and go.  He also gives history of distress.  Urinary hesitancy-patient states that he has tried Flomax in the past.  Because he has history of IBS, frequent headaches and chronic pain I am concerned that he may have a component of myofascial pain and possibility of  interstitial cystitis.  I also discussed with him  that if his symptoms persist then he may see a urologist.  Gerrit states he has dealt with a lot of stress.  He exercises on a regular basis to deal with the stress.  Benefits of water aerobics and swimming were discussed.  Benefits of stretching exercises and meditation also discussed.  Orders: Orders Placed This Encounter  Procedures   Sedimentation rate   Rheumatoid factor   Cyclic citrul peptide antibody, IgG   No orders of the defined types were placed in this encounter.   Face-to-face time spent with patient was 45 minutes. Greater than 50% of time was spent in counseling and coordination of care.  Follow-Up Instructions: Return if symptoms worsen or fail to improve, for Rash and joint pain.   Maya Nash, MD  Note - This record has been created using Animal nutritionist.  Chart creation errors have been sought, but may not always  have been located. Such creation errors do not reflect on  the standard of medical care.

## 2023-10-20 ENCOUNTER — Ambulatory Visit: Payer: 59

## 2023-10-20 NOTE — Therapy (Incomplete)
OUTPATIENT PHYSICAL THERAPY LOWER EXTREMITY EVALUATION   Patient Name: Trevor White MRN: [id number] DOB:08/22/1991, 33 y.o., male Today's Date: 10/20/2023  END OF SESSION:   Past Medical History:  Diagnosis Date   Angio-edema    Headache    Lightheadedness    RMSF [facility] spotted fever) 03/31/2022   Past Surgical History:  Procedure Laterality Date   INGUINAL HERNIA REPAIR Bilateral 06/22/2023   Procedure: LAPAROSCOPIC BILATERAL INGUINAL HERNIA REPAIR WITH MESH;  Surgeon: Kinsinger, De Blanch, MD;  Location: [facility];  Service: General;  Laterality: Bilateral;   INSERTION OF MESH Bilateral 06/22/2023   Procedure: INSERTION OF MESH;  Surgeon: Sheliah Hatch De Blanch, MD;  Location: [facility] OR;  Service: General;  Laterality: Bilateral;   SHOULDER SURGERY Left 2021   Patient Active Problem List   Diagnosis Date Noted   Headache 05/05/2022    PCP: ***  REFERRING PROVIDER: ***  REFERRING DIAG: ***  THERAPY DIAG:  No diagnosis found.  Rationale for Evaluation and Treatment: {HABREHAB:27488}  ONSET DATE: ***  SUBJECTIVE:   SUBJECTIVE STATEMENT: ***  PERTINENT HISTORY: *** PAIN:  Are you having pain? {OPRCPAIN:27236}  PRECAUTIONS: {Therapy precautions:24002}  RED FLAGS: {PT Red Flags:29287}   WEIGHT BEARING RESTRICTIONS: {Yes ***/No:24003}  FALLS:  Has patient fallen in last 6 months? {fallsyesno:27318}  LIVING ENVIRONMENT: Lives with: {OPRC lives with:25569::"lives with their family"} Lives in: {Lives in:25570} Stairs: {opstairs:27293} Has following equipment at home: {Assistive devices:23999}  OCCUPATION: ***  PLOF: {PLOF:24004}  PATIENT GOALS: ***  NEXT MD VISIT: ***  OBJECTIVE:  Note: Objective measures were completed at Evaluation unless otherwise noted.  DIAGNOSTIC FINDINGS: ***  PATIENT SURVEYS:  {rehab surveys:24030}  COGNITION: Overall cognitive status: {cognition:24006}     SENSATION: {sensation:27233}  EDEMA:   {edema:24020}  MUSCLE LENGTH: Hamstrings: Right *** deg; Left *** deg Maisie Fus test: Right *** deg; Left *** deg  POSTURE: {posture:25561}  PALPATION: ***  LOWER EXTREMITY ROM:  {AROM/PROM:27142} ROM Right eval Left eval  Hip flexion    Hip extension    Hip abduction    Hip adduction    Hip internal rotation    Hip external rotation    Knee flexion    Knee extension    Ankle dorsiflexion    Ankle plantarflexion    Ankle inversion    Ankle eversion     (Blank rows = not tested)  LOWER EXTREMITY MMT:  MMT Right eval Left eval  Hip flexion    Hip extension    Hip abduction    Hip adduction    Hip internal rotation    Hip external rotation    Knee flexion    Knee extension    Ankle dorsiflexion    Ankle plantarflexion    Ankle inversion    Ankle eversion     (Blank rows = not tested)  LOWER EXTREMITY SPECIAL TESTS:  {LEspecialtests:26242}  FUNCTIONAL TESTS:  {Functional tests:24029}  GAIT: Distance walked: *** Assistive device utilized: {Assistive devices:23999} Level of assistance: {Levels of assistance:24026} Comments: ***  TREATMENT DATE: ***    PATIENT EDUCATION:  Education details: *** Person educated: {Person educated:25204} Education method: {Education Method:25205} Education comprehension: {Education Comprehension:25206}  HOME EXERCISE PROGRAM: ***  ASSESSMENT:  CLINICAL IMPRESSION: Patient is a *** y.o. *** who was seen today for physical therapy evaluation and treatment for ***.   OBJECTIVE IMPAIRMENTS: {opptimpairments:25111}.   ACTIVITY LIMITATIONS: {activitylimitations:27494}  PARTICIPATION LIMITATIONS: {participationrestrictions:25113}  PERSONAL FACTORS: {Personal factors:25162} are also affecting patient's functional outcome.   REHAB POTENTIAL: {rehabpotential:25112}  CLINICAL DECISION MAKING:  {clinical decision making:25114}  EVALUATION COMPLEXITY: {Evaluation complexity:25115}   GOALS: Goals reviewed with patient? {yes/no:20286}  SHORT TERM GOALS: Target date: *** *** Baseline: Goal status: INITIAL  2.  *** Baseline:  Goal status: INITIAL  3.  *** Baseline:  Goal status: INITIAL  4.  *** Baseline:  Goal status: INITIAL  5.  *** Baseline:  Goal status: INITIAL  6.  *** Baseline:  Goal status: INITIAL  LONG TERM GOALS: Target date: ***  *** Baseline:  Goal status: INITIAL  2.  *** Baseline:  Goal status: INITIAL  3.  *** Baseline:  Goal status: INITIAL  4.  *** Baseline:  Goal status: INITIAL  5.  *** Baseline:  Goal status: INITIAL  6.  *** Baseline:  Goal status: INITIAL   PLAN:  PT FREQUENCY: {rehab frequency:25116}  PT DURATION: {rehab duration:25117}  PLANNED INTERVENTIONS: {rehab planned interventions:25118::"97110-Therapeutic exercises","97530- Therapeutic [phone]- Neuromuscular re-education","97535- Self JXBJ","47829- Manual therapy"}  PLAN FOR NEXT SESSION: ***   Sipriano Fendley, PT 10/20/2023, 6:16 AM

## 2023-10-25 ENCOUNTER — Ambulatory Visit: Payer: 59 | Admitting: Physical Therapy

## 2023-10-31 ENCOUNTER — Ambulatory Visit: Payer: 59 | Attending: Rheumatology | Admitting: Rheumatology

## 2023-10-31 ENCOUNTER — Encounter: Payer: Self-pay | Admitting: Rheumatology

## 2023-10-31 VITALS — BP 123/83 | HR 77 | Resp 15 | Ht 65.0 in | Wt 132.0 lb

## 2023-10-31 DIAGNOSIS — A770 Spotted fever due to Rickettsia rickettsii: Secondary | ICD-10-CM

## 2023-10-31 DIAGNOSIS — F439 Reaction to severe stress, unspecified: Secondary | ICD-10-CM

## 2023-10-31 DIAGNOSIS — M25551 Pain in right hip: Secondary | ICD-10-CM | POA: Diagnosis not present

## 2023-10-31 DIAGNOSIS — K581 Irritable bowel syndrome with constipation: Secondary | ICD-10-CM | POA: Diagnosis not present

## 2023-10-31 DIAGNOSIS — R519 Headache, unspecified: Secondary | ICD-10-CM

## 2023-10-31 DIAGNOSIS — G8929 Other chronic pain: Secondary | ICD-10-CM

## 2023-10-31 DIAGNOSIS — Z91018 Allergy to other foods: Secondary | ICD-10-CM

## 2023-10-31 DIAGNOSIS — R3911 Hesitancy of micturition: Secondary | ICD-10-CM

## 2023-10-31 DIAGNOSIS — R21 Rash and other nonspecific skin eruption: Secondary | ICD-10-CM

## 2023-10-31 DIAGNOSIS — M25552 Pain in left hip: Secondary | ICD-10-CM

## 2023-10-31 NOTE — Therapy (Incomplete)
OUTPATIENT PHYSICAL THERAPY LOWER EXTREMITY EVALUATION   Patient Name: Trevor White MRN: [id number] DOB:1991-05-06, 33 y.o., male Today's Date: 10/31/2023  END OF SESSION:   Past Medical History:  Diagnosis Date   Angio-edema    Headache    Lightheadedness    RMSF [facility] spotted fever) 03/31/2022   Past Surgical History:  Procedure Laterality Date   INGUINAL HERNIA REPAIR Bilateral 06/22/2023   Procedure: LAPAROSCOPIC BILATERAL INGUINAL HERNIA REPAIR WITH MESH;  Surgeon: Kinsinger, De Blanch, MD;  [facility]: [facility];  Service: General;  Laterality: Bilateral;   INSERTION OF MESH Bilateral 06/22/2023   Procedure: INSERTION OF MESH;  Surgeon: Sheliah Hatch De Blanch, MD;  Location: [facility] OR;  Service: General;  Laterality: Bilateral;   SHOULDER SURGERY Left 2021   SKIN BIOPSY  2024   on chest   Patient Active Problem List   Diagnosis Date Noted   Headache 05/05/2022    PCP: Lise Auer, MD   REFERRING PROVIDER: ***  REFERRING DIAG: ***  THERAPY DIAG:  No diagnosis found.  Rationale for Evaluation and Treatment: Rehabilitation  ONSET DATE: ***  SUBJECTIVE:   SUBJECTIVE STATEMENT: ***  PERTINENT HISTORY: *** PAIN:  Are you having pain? Yes: NPRS scale: *** Pain location: *** Pain description: *** Aggravating factors: *** Relieving factors: ***  PRECAUTIONS: {Therapy precautions:24002}  RED FLAGS: {PT Red Flags:29287}   WEIGHT BEARING RESTRICTIONS: No  FALLS:  Has patient fallen in last 6 months? {fallsyesno:27318}  LIVING ENVIRONMENT: Lives with: {[facility] lives with:25569::"lives with their family"} Lives in: {Lives in:[address]70} Stairs: {opstairs:27293} Has following equipment at home: {Assistive devices:23999}  OCCUPATION: ***  PLOF: {PLOF:24004}  PATIENT GOALS: ***  NEXT MD VISIT: ***  OBJECTIVE:  Note: Objective measures were completed at Evaluation unless otherwise noted.  DIAGNOSTIC FINDINGS: ***  PATIENT SURVEYS:  {rehab  surveys:24030}  COGNITION: Overall cognitive status: {cognition:24006}     SENSATION: {sensation:27233}  EDEMA:  {edema:24020}  MUSCLE LENGTH: Hamstrings: Right *** deg; Left *** deg Maisie Fus test: Right *** deg; Left *** deg  POSTURE: {posture:25561}  PALPATION: ***  LOWER EXTREMITY ROM:  {AROM/PROM:27142} ROM Right eval Left eval  Hip flexion    Hip extension    Hip abduction    Hip adduction    Hip internal rotation    Hip external rotation    Knee flexion    Knee extension    Ankle dorsiflexion    Ankle plantarflexion    Ankle inversion    Ankle eversion     (Blank rows = not tested)  LOWER EXTREMITY MMT:  MMT Right eval Left eval  Hip flexion    Hip extension    Hip abduction    Hip adduction    Hip internal rotation    Hip external rotation    Knee flexion    Knee extension    Ankle dorsiflexion    Ankle plantarflexion    Ankle inversion    Ankle eversion     (Blank rows = not tested)  LOWER EXTREMITY SPECIAL TESTS:  {LEspecialtests:26242}  FUNCTIONAL TESTS:  {Functional tests:24029}  GAIT: Distance walked: *** Assistive device utilized: {Assistive devices:23999} Level of assistance: {Levels of assistance:24026} Comments: ***  TREATMENT DATE:  [facility] Adult PT Treatment:                                                DATE: 11/01/23 Therapeutic Exercise: *** Manual Therapy: *** Neuromuscular re-ed: *** Therapeutic Activity: *** Modalities: *** Self Care: ***    PATIENT EDUCATION:  Education details: *** Person educated: {Person educated:25204} Education method: {Education Method:25205} Education comprehension: {Education Comprehension:25206}  HOME EXERCISE PROGRAM: ***  ASSESSMENT:  CLINICAL IMPRESSION: Patient is a *** y.o. *** who was seen today for physical therapy evaluation and treatment for  ***.   OBJECTIVE IMPAIRMENTS: {opptimpairments:25111}.   ACTIVITY LIMITATIONS: {activitylimitations:27494}  PARTICIPATION LIMITATIONS: {participationrestrictions:25113}  PERSONAL FACTORS: {Personal factors:25162} are also affecting patient's functional outcome.   REHAB POTENTIAL: {rehabpotential:25112}  CLINICAL DECISION MAKING: {clinical decision making:25114}  EVALUATION COMPLEXITY: {Evaluation complexity:25115}   GOALS: Goals reviewed with patient? {yes/no:20286}  SHORT TERM GOALS: Target date: *** *** Baseline: Goal status: INITIAL  2.  *** Baseline:  Goal status: INITIAL  3.  *** Baseline:  Goal status: INITIAL  4.  *** Baseline:  Goal status: INITIAL  5.  *** Baseline:  Goal status: INITIAL  6.  *** Baseline:  Goal status: INITIAL  LONG TERM GOALS: Target date: ***  *** Baseline:  Goal status: INITIAL  2.  *** Baseline:  Goal status: INITIAL  3.  *** Baseline:  Goal status: INITIAL  4.  *** Baseline:  Goal status: INITIAL  5.  *** Baseline:  Goal status: INITIAL  6.  *** Baseline:  Goal status: INITIAL   PLAN:  PT FREQUENCY: {rehab frequency:25116}  PT DURATION: {rehab duration:25117}  PLANNED INTERVENTIONS: {rehab planned interventions:25118::"97110-Therapeutic exercises","97530- Therapeutic [phone]- Neuromuscular re-education","97535- Self YNWG","95621- Manual therapy"}  PLAN FOR NEXT SESSION: Joellyn Rued, PT 10/31/2023, 6:25 PM

## 2023-11-01 ENCOUNTER — Ambulatory Visit: Payer: 59

## 2023-11-02 LAB — CYCLIC CITRUL PEPTIDE ANTIBODY, IGG: Cyclic Citrullin Peptide Ab: <16 U

## 2023-11-02 LAB — RHEUMATOID FACTOR: Rheumatoid fact SerPl-aCnc: <10 [IU]/mL (ref <14)

## 2023-11-02 LAB — SEDIMENTATION RATE: Sed Rate: 2 mm/h (ref 0–15)

## 2023-11-02 NOTE — Progress Notes (Signed)
 Sed rate is normal, rheumatoid factor and anti-CCP are negative.  I will discuss results at the follow-up visit.

## 2023-11-08 ENCOUNTER — Encounter: Payer: 59 | Admitting: Physical Therapy

## 2023-11-10 ENCOUNTER — Encounter: Payer: 59 | Admitting: Physical Therapy

## 2023-11-17 ENCOUNTER — Telehealth: Payer: Self-pay | Admitting: Rheumatology

## 2023-11-17 NOTE — Progress Notes (Deleted)
 Office Visit Note  Patient: Trevor White             Date of Birth: 05/21/1991           MRN: [id number]             PCP: Lise Auer, MD Referring: Lise Auer, MD Visit Date: 12/01/2023 Occupation: @GUAROCC @  Subjective:  No chief complaint on file.   History of Present Illness: Trevor White is a 33 y.o. male ***     Activities of Daily Living:  Patient reports morning stiffness for *** {minute/hour:19697}.   Patient {ACTIONS;DENIES/REPORTS:21021675::"Denies"} nocturnal pain.  Difficulty dressing/grooming: {ACTIONS;DENIES/REPORTS:21021675::"Denies"} Difficulty climbing stairs: {ACTIONS;DENIES/REPORTS:21021675::"Denies"} Difficulty getting out of chair: {ACTIONS;DENIES/REPORTS:21021675::"Denies"} Difficulty using hands for taps, buttons, cutlery, and/or writing: {ACTIONS;DENIES/REPORTS:21021675::"Denies"}  No Rheumatology ROS completed.   PMFS History:  Patient Active Problem List   Diagnosis Date Noted   Headache 05/05/2022    Past Medical History:  Diagnosis Date   Angio-edema    Headache    Lightheadedness    RMS[facility] spotted fever) 03/31/2022    Family History  Problem Relation Age of Onset   Hypertension Mother    Basal cell carcinoma Mother    Healthy Brother    Asthma Brother    Stroke Paternal Uncle    Healthy Daughter    Allergic rhinitis Neg Hx    Eczema Neg Hx    Urticaria Neg Hx    Past Surgical History:  Procedure Laterality Date   INGUINAL HERNIA REPAIR Bilateral 06/22/2023   Procedure: LAPAROSCOPIC BILATERAL INGUINAL HERNIA REPAIR WITH MESH;  Surgeon: Kinsinger, De Blanch, MD;  Location: [facility] OR;  Service: General;  Laterality: Bilateral;   INSERTION OF MESH Bilateral 06/22/2023   Procedure: INSERTION OF MESH;  Surgeon: Sheliah Hatch De Blanch, MD;  Location: [facility] OR;  Service: General;  Laterality: Bilateral;   SHOULDER SURGERY Left 2021   SKIN BIOPSY  2024   on chest   Social History   Social History Narrative   Lives  with wife, child   Caffeine maybe once a week   Immunization History  Administered Date(s) Administered   [employer] Vaccination 12/05/2019, 01/07/2020     Objective: Vital Signs: There were no vitals taken for this visit.   Physical Exam   Musculoskeletal Exam: ***  CDAI Exam: CDAI Score: -- Patient Global: --; Provider Global: -- Swollen: --; Tender: -- Joint Exam 12/01/2023   No joint exam has been documented for this visit   There is currently no information documented on the homunculus. Go to the Rheumatology activity and complete the homunculus joint exam.  Investigation: No additional findings.  Imaging: No results found.  Recent Labs: Lab Results  Component Value Date   WBC 4.5 06/16/2023   HGB 12.8 (L) 06/16/2023   PLT 209 06/16/2023   NA 142 05/10/2023   K 4.0 05/10/2023   CL 101 05/10/2023   CO2 25 05/10/2023   GLUCOSE 69 (L) 05/10/2023   BUN 20 05/10/2023   CREATININE 1.11 05/10/2023   BILITOT 0.9 05/10/2023   ALKPHOS 58 05/10/2023   AST 23 05/10/2023   ALT 17 05/10/2023   PROT 7.5 05/10/2023   ALBUMIN 4.9 05/10/2023   CALCIUM 9.8 05/10/2023   October 31, 2023 sed rate 2, RF negative, anti-CCP negative  Speciality Comments: No specialty comments available.  Procedures:  No procedures performed Allergies: Diclofenac sodium, Wound dressing adhesive, and Alpha-gal   Assessment / Plan:     Visit Diagnoses: No diagnosis found.  Orders: No orders of the defined types were placed in this encounter.  No orders of the defined types were placed in this encounter.   Face-to-face time spent with patient was *** minutes. Greater than 50% of time was spent in counseling and coordination of care.  Follow-Up Instructions: No follow-ups on file.   Pollyann Savoy, MD  Note - This record has been created using Animal nutritionist.  Chart creation errors have been sought, but may not always  have been located. Such creation errors do not  reflect on  the standard of medical care.

## 2023-11-17 NOTE — Telephone Encounter (Signed)
Patient was advised at the initial visit that if the labs are normal he may return for a follow-up visit on a as needed basis.  The following labs were within normal limits: Sed rate normal, RF negative, anti-CCP negative.  Please notify patient and cancel new patient follow-up visit. Thank you, Pollyann Savoy, MD

## 2023-11-17 NOTE — Telephone Encounter (Signed)
I have called patient and advised per Dr. Corliss Skains, The following labs were within normal limits: Sed rate normal, RF negative, anti-CCP negative. He can follow up on an as needed basis. He verbalized understanding and the appointment on 12/01/2023 has been canceled.

## 2023-12-01 ENCOUNTER — Ambulatory Visit: Payer: 59 | Admitting: Rheumatology

## 2023-12-11 ENCOUNTER — Other Ambulatory Visit: Payer: Self-pay | Admitting: Ophthalmology

## 2023-12-11 DIAGNOSIS — H05812 Cyst of left orbit: Secondary | ICD-10-CM

## 2023-12-13 ENCOUNTER — Ambulatory Visit
Admission: RE | Admit: 2023-12-13 | Discharge: 2023-12-13 | Disposition: A | Discharge status: Home / Self Care | Source: Ambulatory Visit | Attending: Ophthalmology | Admitting: Ophthalmology

## 2023-12-13 DIAGNOSIS — H05812 Cyst of left orbit: Secondary | ICD-10-CM

## 2023-12-13 MED ORDER — IOPAMIDOL (ISOVUE-300) INJECTION 61%
75.0000 mL | Freq: Once | INTRAVENOUS | Status: AC | PRN
  Administered 2023-12-13: 75 mL via INTRAVENOUS

## 2023-12-14 ENCOUNTER — Ambulatory Visit: Admitting: Podiatry

## 2023-12-14 ENCOUNTER — Encounter: Payer: Self-pay | Admitting: Podiatry

## 2023-12-14 ENCOUNTER — Ambulatory Visit (INDEPENDENT_AMBULATORY_CARE_PROVIDER_SITE_OTHER)

## 2023-12-14 DIAGNOSIS — S90812A Abrasion, left foot, initial encounter: Secondary | ICD-10-CM | POA: Diagnosis not present

## 2023-12-14 DIAGNOSIS — L923 Foreign body granuloma of the skin and subcutaneous tissue: Secondary | ICD-10-CM | POA: Diagnosis not present

## 2023-12-14 NOTE — Progress Notes (Signed)
 Subjective:   Patient ID: Trevor White, male   DOB: 33 y.o.   MRN: [id number]   HPI Patient presents with a small metallic piece of metal in the left heel that does not bother him and probably occurred years ago but there is concerned about MRI associated with this and whether or not it should be removed.  Patient is due to have an MRI of the left eye with cyst behind the eye   Review of Systems  All other systems reviewed and are negative.       Objective:  Physical Exam Vitals and nursing note reviewed.  Constitutional:      Appearance: He is well-developed.  Pulmonary:     Effort: Pulmonary effort is normal.  Musculoskeletal:        General: Normal range of motion.  Skin:    General: Skin is warm.  Neurological:     Mental Status: He is alert.     Neurovascular status intact muscle strength adequate range of motion within normal limits with patient found to have no portal of entry on the plantar heel no indications of trauma with a very small metallic piece of metal within the  heel.  Good digital perfusion well-oriented x 3     Assessment:  Very small metallic foreign body right heel that would be extremely difficult to remove as it would be very difficult to locate given its small size and no portal of entry     Plan:  H&P reviewed and discussed with another physician in group and we both agree this should not stop him from having an MRI done of his head and he did have 1 done in 23 after he already have this with no issues with it so I strongly encouraged him to talk to his ophthalmologist at radiologist about getting this done.  If it turns out absolutely they cannot do it without this to be removed it could be done on an outpatient basis but again would be difficult at this to remove and we would hate to have to make a incision on the bottom of the foot.  Explained all this to him  X-ray indicates a very small several millimeter metallic piece of metal shards he works  around metal right plantar heel that is probably been there for years

## 2024-08-28 ENCOUNTER — Encounter (HOSPITAL_COMMUNITY): Payer: Self-pay | Admitting: General Surgery
